# Patient Record
Sex: Male | Born: 1990 | Race: White | Hispanic: No | Marital: Single | Smoking: Former smoker
Health system: Southern US, Community
[De-identification: ages and names within clinical notes are randomized; demographics above are authoritative.]

---
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
## 2009-09-18 ENCOUNTER — Emergency Department (HOSPITAL_COMMUNITY): Admission: EM | Admit: 2009-09-18 | Discharge: 2009-09-18 | Payer: Self-pay | Admitting: Emergency Medicine

## 2010-03-05 ENCOUNTER — Emergency Department (HOSPITAL_COMMUNITY)
Admission: EM | Admit: 2010-03-05 | Discharge: 2010-03-05 | Disposition: A | Discharge status: Home / Self Care | Payer: Self-pay | Attending: Emergency Medicine | Admitting: Emergency Medicine

## 2010-03-05 DIAGNOSIS — L0501 Pilonidal cyst with abscess: Secondary | ICD-10-CM | POA: Insufficient documentation

## 2010-03-07 ENCOUNTER — Emergency Department (HOSPITAL_COMMUNITY)
Admission: EM | Admit: 2010-03-07 | Discharge: 2010-03-07 | Disposition: A | Discharge status: Home / Self Care | Payer: Self-pay | Attending: Emergency Medicine | Admitting: Emergency Medicine

## 2010-03-07 DIAGNOSIS — Z4801 Encounter for change or removal of surgical wound dressing: Secondary | ICD-10-CM | POA: Insufficient documentation

## 2010-03-07 DIAGNOSIS — I10 Essential (primary) hypertension: Secondary | ICD-10-CM | POA: Insufficient documentation

## 2010-03-29 LAB — URINE CULTURE
Colony Count: NO GROWTH
Culture  Setup Time: 201109061950
Culture: NO GROWTH

## 2010-03-29 LAB — URINE MICROSCOPIC-ADD ON

## 2010-03-29 LAB — DIFFERENTIAL
Basophils Absolute: 0.1 10*3/uL (ref 0.0–0.1)
Basophils Relative: 1 % (ref 0–1)
Eosinophils Absolute: 0.2 10*3/uL (ref 0.0–0.7)
Eosinophils Relative: 2 % (ref 0–5)
Lymphocytes Relative: 18 % (ref 12–46)
Lymphs Abs: 1.7 10*3/uL (ref 0.7–4.0)
Monocytes Absolute: 0.6 10*3/uL (ref 0.1–1.0)
Monocytes Relative: 6 % (ref 3–12)
Neutro Abs: 6.9 10*3/uL (ref 1.7–7.7)
Neutrophils Relative %: 73 % (ref 43–77)

## 2010-03-29 LAB — URINALYSIS, ROUTINE W REFLEX MICROSCOPIC
Bilirubin Urine: NEGATIVE
Glucose, UA: NEGATIVE mg/dL
Ketones, ur: NEGATIVE mg/dL
Nitrite: NEGATIVE
Specific Gravity, Urine: 1.03 (ref 1.005–1.030)
Urobilinogen, UA: 0.2 mg/dL (ref 0.0–1.0)
pH: 5.5 (ref 5.0–8.0)

## 2010-03-29 LAB — BASIC METABOLIC PANEL
Calcium: 9.4 mg/dL (ref 8.4–10.5)
Creatinine, Ser: 1.11 mg/dL (ref 0.4–1.5)
GFR calc Af Amer: >60 mL/min (ref >60)
GFR calc non Af Amer: >60 mL/min (ref >60)
Glucose, Bld: 110 mg/dL — ABNORMAL HIGH (ref 70–99)
Sodium: 140 mEq/L (ref 135–145)

## 2010-03-29 LAB — CBC
Hemoglobin: 15.9 g/dL (ref 13.0–17.0)
MCHC: 34.1 g/dL (ref 30.0–36.0)
Platelets: 229 10*3/uL (ref 150–400)
RBC: 5.27 MIL/uL (ref 4.22–5.81)

## 2010-10-23 LAB — URINALYSIS, ROUTINE W REFLEX MICROSCOPIC
Bilirubin Urine: NEGATIVE
Glucose, UA: NEGATIVE
Ketones, ur: NEGATIVE
pH: 6.5

## 2011-10-07 ENCOUNTER — Encounter (HOSPITAL_COMMUNITY): Payer: Self-pay | Admitting: *Deleted

## 2011-10-07 ENCOUNTER — Emergency Department (HOSPITAL_COMMUNITY): Payer: Self-pay

## 2011-10-07 ENCOUNTER — Emergency Department (HOSPITAL_COMMUNITY)
Admission: EM | Admit: 2011-10-07 | Discharge: 2011-10-07 | Disposition: A | Discharge status: Home / Self Care | Payer: Self-pay | Attending: Emergency Medicine | Admitting: Emergency Medicine

## 2011-10-07 DIAGNOSIS — F17200 Nicotine dependence, unspecified, uncomplicated: Secondary | ICD-10-CM | POA: Insufficient documentation

## 2011-10-07 DIAGNOSIS — S93499A Sprain of other ligament of unspecified ankle, initial encounter: Secondary | ICD-10-CM | POA: Insufficient documentation

## 2011-10-07 DIAGNOSIS — W1789XA Other fall from one level to another, initial encounter: Secondary | ICD-10-CM | POA: Insufficient documentation

## 2011-10-07 DIAGNOSIS — S93402A Sprain of unspecified ligament of left ankle, initial encounter: Secondary | ICD-10-CM

## 2011-10-07 MED ORDER — IBUPROFEN 600 MG PO TABS: 600.0000 mg | ORAL_TABLET | Freq: Four times a day (QID) | ORAL | Status: DC | PRN

## 2011-10-07 NOTE — ED Notes (Signed)
Fell when stepping off porch, ankle injury, with swelling and pain

## 2011-10-11 NOTE — ED Provider Notes (Signed)
Medical screening examination/treatment/procedure(s) were performed by non-physician practitioner and as supervising physician I was immediately available for consultation/collaboration.   Benny Lennert, MD 10/11/11 2250

## 2011-10-11 NOTE — ED Provider Notes (Signed)
History     CSN: [id number]  Arrival date & time 10/07/11  1412   First MD Initiated Contact with Patient 10/07/11 1455      Chief Complaint  Patient presents with  . Fall    (Consider location/radiation/quality/duration/timing/severity/associated sxs/prior treatment) HPI Comments: Jacob Odom presents with pain to his left ankle after he fell off is porch prior to arrival,  Inverting his left foot when he landed,  Feeling a "popping" sensation.  He can bear weight but with increased pain.  He has  Taken no medicines nor any treatments prior to arrival.  Pain is constant,  Worse with movement, palpation and range of motion.  There is no radiation of pain.  The history is provided by the patient.    History reviewed. No pertinent past medical history.  History reviewed. No pertinent past surgical history.  History reviewed. No pertinent family history.  History  Substance Use Topics  . Smoking status: Current Every Day Smoker  . Smokeless tobacco: Not on file  . Alcohol Use:       Review of Systems  Musculoskeletal: Positive for joint swelling and arthralgias.  Skin: Negative for wound.  Neurological: Negative for weakness and numbness.    Allergies  Review of patient's allergies indicates no known allergies.  Home Medications   Current Outpatient Rx  Name Route Sig Dispense Refill  . IBUPROFEN 600 MG PO TABS Oral Take 1 tablet (600 mg total) by mouth every 6 (six) hours as needed for pain. 30 tablet 0    BP 153/72  Pulse 91  Temp 99.8 F (37.7 C) (Oral)  Resp 18  Ht 6' (1.829 m)  Wt 285 lb (129.275 kg)  BMI 38.65 kg/m2  SpO2 100%  Physical Exam  Nursing note and vitals reviewed. Constitutional: He appears well-developed and well-nourished.  HENT:  Head: Normocephalic.  Cardiovascular: Normal rate and intact distal pulses.  Exam reveals no decreased pulses.   Pulses:      Dorsalis pedis pulses are 2+ on the right side, and 2+ on the left side.         Posterior tibial pulses are 2+ on the right side, and 2+ on the left side.  Musculoskeletal: He exhibits edema and tenderness.       Left ankle: He exhibits swelling. He exhibits no ecchymosis and normal pulse. tenderness. Lateral malleolus tenderness found. No proximal fibula tenderness found. Achilles tendon normal.  Neurological: He is alert. No sensory deficit.  Skin: Skin is warm, dry and intact.    ED Course  Procedures (including critical care time)  Labs Reviewed - No data to display No results found.   1. Left ankle sprain       MDM  ASO  Provided, pt has crutches at home.  Cap refill normal after ASO applied.  RICE, referral to ortho if pain symptoms and swelling are not better over the next week.            Burgess Amor, [place] 10/11/11 2216

## 2012-04-08 ENCOUNTER — Other Ambulatory Visit (HOSPITAL_COMMUNITY): Payer: Self-pay | Admitting: Physician Assistant

## 2012-04-08 DIAGNOSIS — R51 Headache: Secondary | ICD-10-CM

## 2012-04-10 ENCOUNTER — Ambulatory Visit (HOSPITAL_COMMUNITY)
Admission: RE | Admit: 2012-04-10 | Discharge: 2012-04-10 | Disposition: A | Discharge status: Home / Self Care | Payer: Self-pay | Source: Ambulatory Visit | Attending: Physician Assistant | Admitting: Physician Assistant

## 2012-04-10 DIAGNOSIS — R51 Headache: Secondary | ICD-10-CM | POA: Insufficient documentation

## 2012-04-10 MED ORDER — GADOBENATE DIMEGLUMINE 529 MG/ML IV SOLN
20.0000 mL | Freq: Once | INTRAVENOUS | Status: AC | PRN
  Administered 2012-04-10: 20 mL via INTRAVENOUS

## 2012-06-12 ENCOUNTER — Other Ambulatory Visit: Payer: Self-pay | Admitting: Occupational Medicine

## 2012-06-12 ENCOUNTER — Ambulatory Visit: Payer: Self-pay

## 2012-06-12 DIAGNOSIS — R52 Pain, unspecified: Secondary | ICD-10-CM

## 2014-08-01 ENCOUNTER — Ambulatory Visit (INDEPENDENT_AMBULATORY_CARE_PROVIDER_SITE_OTHER): Payer: Worker's Compensation | Admitting: Emergency Medicine

## 2014-08-01 ENCOUNTER — Ambulatory Visit: Payer: Worker's Compensation

## 2014-08-01 DIAGNOSIS — S6722XA Crushing injury of left hand, initial encounter: Secondary | ICD-10-CM

## 2014-08-01 DIAGNOSIS — S61219A Laceration without foreign body of unspecified finger without damage to nail, initial encounter: Secondary | ICD-10-CM

## 2014-08-01 DIAGNOSIS — S62639B Displaced fracture of distal phalanx of unspecified finger, initial encounter for open fracture: Secondary | ICD-10-CM

## 2014-08-01 MED ORDER — CEPHALEXIN 500 MG PO CAPS: 500.0000 mg | ORAL_CAPSULE | Freq: Four times a day (QID) | ORAL | Status: DC

## 2014-08-01 MED ORDER — HYDROCODONE-ACETAMINOPHEN 5-325 MG PO TABS
1.0000 | ORAL_TABLET | Freq: Four times a day (QID) | ORAL | Status: DC | PRN
Start: 2014-08-01 — End: 2014-08-31

## 2014-08-01 NOTE — Progress Notes (Signed)
Verbal consent obtained from patient.  Digital block anesthesia with 5cc 2% plain lido.  Wound scrubbed with soap and water and rinsed. Subcutaneous tissue, bony fragment, and excess flapping skin removed. Wound closed with #10 5-0 ethilon simple interrupted sutures.  Wound cleansed and dressed with pressure dressing. Bactroban topical applied over wound.

## 2014-08-01 NOTE — Progress Notes (Signed)
Subjective:  Patient ID: Jacob Odom, male    DOB: January 25, 1990  Age: 24 y.o. MRN: [id number]  CC: No chief complaint on file.   HPI Jacob Jacob Odom presents  with an injury to his left fourth finger. Was working holding the wheel and someone hit the wheel with a 4 x 4 piece of lumbar and struck him in the finger. He has intense pain with a less complex laceration of the distal phalanx of the finger. He denies any other complaints. He is not current on tetanus.  History Jacob Odom has no past medical history on file.   He has no past surgical history on file.   His  family history is not on file.    Review of Systems  Constitutional: Negative for fever, chills and appetite change.  HENT: Negative for congestion, ear pain, postnasal drip, sinus pressure and sore throat.   Eyes: Negative for pain and redness.  Respiratory: Negative for cough, shortness of breath and wheezing.   Cardiovascular: Negative for leg swelling.  Gastrointestinal: Negative for nausea, vomiting, abdominal pain, diarrhea, constipation and blood in stool.  Endocrine: Negative for polyuria.  Genitourinary: Negative for dysuria, urgency, frequency and flank pain.  Musculoskeletal: Negative for gait problem.  Skin: Negative for rash.  Neurological: Negative for weakness and headaches.  Psychiatric/Behavioral: Negative for confusion and decreased concentration. The patient is not nervous/anxious.     Objective:  There were no vitals taken for this visit.  Physical Exam  Constitutional: He is oriented to person, place, and time. He appears well-developed and well-nourished.  HENT:  Head: Normocephalic and atraumatic.  Eyes: Conjunctivae are normal. Pupils are equal, round, and reactive to light.  Pulmonary/Chest: Effort normal.  Musculoskeletal: He exhibits no edema.  Neurological: He is alert and oriented to person, place, and time.  Skin: Skin is dry. Laceration noted.  Psychiatric: He has a normal  mood and affect. His behavior is normal. Thought content normal.   is a complex laceration terminal phalanx of fourth finger of left hand. There is a fishmouth laceration not involving the nail and nailbed. There is no deformity there's evidence of neurovascular tendon injury. Is no foreign body visible.    Assessment & Plan:   Diagnoses and all orders for this visit:  Crushing injury of finger, left, initial encounter Orders: -     Tdap vaccine greater than or equal to 7yo IM -     DG Finger Ring Right; Future -     HYDROcodone-acetaminophen (NORCO) 5-325 MG per tablet; Take 1 tablet by mouth every 6 (six) hours as needed.  Open fracture of distal phalanx of left hand, initial encounter Orders: -     HYDROcodone-acetaminophen (NORCO) 5-325 MG per tablet; Take 1 tablet by mouth every 6 (six) hours as needed. -     cephALEXin (KEFLEX) 500 MG capsule; Take 1 capsule (500 mg total) by mouth 4 (four) times daily.  Laceration of finger of left hand, initial encounter   I am having Jacob Odom start on HYDROcodone-acetaminophen and cephALEXin. I am also having him maintain his ibuprofen.  Meds ordered this encounter  Medications  . HYDROcodone-acetaminophen (NORCO) 5-325 MG per tablet    Sig: Take 1 tablet by mouth every 6 (six) hours as needed.    Dispense:  10 tablet    Refill:  0    Order Specific Question:  Supervising Provider    Answer:  COPLAND, JESSICA C [[id number]]  . cephALEXin (KEFLEX) 500 MG capsule  Sig: Take 1 capsule (500 mg total) by mouth 4 (four) times daily.    Dispense:  40 capsule    Refill:  0    Order Specific Question:  Supervising Provider    Answer:  COPLAND, JESSICA C [[id number]]    Appropriate red flag conditions were discussed with the patient as well as actions that should be taken.  Patient expressed his understanding.  Follow-up: Return in about 1 week (around 08/08/2014).  Carmelina Dane, MD   [facility] reading (PRIMARY) by  Dr. Dareen Piano.  Findings:  Comminuted tuft fracture.

## 2014-08-01 NOTE — Patient Instructions (Signed)
Please take the antibiotic four times daily for 10 days.  Take the norco as needed.  Please come back to see [place] in 9 days for suture removal.  Clean the hand lightly with hydrogen peroxide if you notice blood crusting around the wound.  You received the tdap vaccine today.   Laceration Care, Adult A laceration is a cut or lesion that goes through all layers of the skin and into the tissue just beneath the skin. TREATMENT  Some lacerations may not require closure. Some lacerations may not be able to be closed due to an increased risk of infection. It is important to see your caregiver as soon as possible after an injury to minimize the risk of infection and maximize the opportunity for successful closure. If closure is appropriate, pain medicines may be given, if needed. The wound will be cleaned to help prevent infection. Your caregiver will use stitches (sutures), staples, wound glue (adhesive), or skin adhesive strips to repair the laceration. These tools bring the skin edges together to allow for faster healing and a better cosmetic outcome. However, all wounds will heal with a scar. Once the wound has healed, scarring can be minimized by covering the wound with sunscreen during the day for 1 full year. HOME CARE INSTRUCTIONS  For sutures or staples:  Keep the wound clean and dry.  If you were given a bandage (dressing), you should change it at least once a day. Also, change the dressing if it becomes wet or dirty, or as directed by your caregiver.  Wash the wound with soap and water 2 times a day. Rinse the wound off with water to remove all soap. Pat the wound dry with a clean towel.  After cleaning, apply a thin layer of the antibiotic ointment as recommended by your caregiver. This will help prevent infection and keep the dressing from sticking.  You may shower as usual after the first 24 hours. Do not soak the wound in water until the sutures are removed.  Only take over-the-counter  or prescription medicines for pain, discomfort, or fever as directed by your caregiver.  Get your sutures or staples removed as directed by your caregiver. For skin adhesive strips:  Keep the wound clean and dry.  Do not get the skin adhesive strips wet. You may bathe carefully, using caution to keep the wound dry.  If the wound gets wet, pat it dry with a clean towel.  Skin adhesive strips will fall off on their own. You may trim the strips as the wound heals. Do not remove skin adhesive strips that are still stuck to the wound. They will fall off in time. For wound adhesive:  You may briefly wet your wound in the shower or bath. Do not soak or scrub the wound. Do not swim. Avoid periods of heavy perspiration until the skin adhesive has fallen off on its own. After showering or bathing, gently pat the wound dry with a clean towel.  Do not apply liquid medicine, cream medicine, or ointment medicine to your wound while the skin adhesive is in place. This may loosen the film before your wound is healed.  If a dressing is placed over the wound, be careful not to apply tape directly over the skin adhesive. This may cause the adhesive to be pulled off before the wound is healed.  Avoid prolonged exposure to sunlight or tanning lamps while the skin adhesive is in place. Exposure to ultraviolet light in the first year will darken  the scar.  The skin adhesive will usually remain in place for 5 to 10 days, then naturally fall off the skin. Do not pick at the adhesive film. You may need a tetanus shot if:  You cannot remember when you had your last tetanus shot.  You have never had a tetanus shot. If you get a tetanus shot, your arm may swell, get red, and feel warm to the touch. This is common and not a problem. If you need a tetanus shot and you choose not to have one, there is a rare chance of getting tetanus. Sickness from tetanus can be serious. SEEK MEDICAL CARE IF:   You have redness,  swelling, or increasing pain in the wound.  You see a red line that goes away from the wound.  You have yellowish-white fluid (pus) coming from the wound.  You have a fever.  You notice a bad smell coming from the wound or dressing.  Your wound breaks open before or after sutures have been removed.  You notice something coming out of the wound such as wood or glass.  Your wound is on your hand or foot and you cannot move a finger or toe. SEEK IMMEDIATE MEDICAL CARE IF:   Your pain is not controlled with prescribed medicine.  You have severe swelling around the wound causing pain and numbness or a change in color in your arm, hand, leg, or foot.  Your wound splits open and starts bleeding.  You have worsening numbness, weakness, or loss of function of any joint around or beyond the wound.  You develop painful lumps near the wound or on the skin anywhere on your body. MAKE SURE YOU:   Understand these instructions.  Will watch your condition.  Will get help right away if you are not doing well or get worse. Document Released: 12/31/2004 Document Revised: 03/25/2011 Document Reviewed: 06/26/2010 [facility] Patient Information 2015 [employer], [place]. This information is not intended to replace advice given to you by your health care provider. Make sure you discuss any questions you have with your health care provider.

## 2014-08-10 ENCOUNTER — Ambulatory Visit (INDEPENDENT_AMBULATORY_CARE_PROVIDER_SITE_OTHER): Payer: Worker's Compensation | Admitting: Emergency Medicine

## 2014-08-10 VITALS — BP 126/88 | HR 83 | Temp 98.6°F | Resp 16 | Ht 72.0 in | Wt 290.4 lb

## 2014-08-10 DIAGNOSIS — S62639D Displaced fracture of distal phalanx of unspecified finger, subsequent encounter for fracture with routine healing: Secondary | ICD-10-CM | POA: Diagnosis not present

## 2014-08-10 DIAGNOSIS — S62639B Displaced fracture of distal phalanx of unspecified finger, initial encounter for open fracture: Secondary | ICD-10-CM | POA: Insufficient documentation

## 2014-08-10 NOTE — Progress Notes (Signed)
   Subjective:    Patient ID: Jacob Odom, male    DOB: 1990/02/16, 24 y.o.   MRN: [id number]  Chief Complaint  Patient presents with  . Suture / Staple Removal    left hand, workers comp injury   Medications, allergies, past medical history, surgical history, family history, social history and problem list reviewed and updated.  HPI  24 yom returns for wound check.   Had open tuft fx 9 days ago. Had 10 sutures placed. Placed on keflex and given fold over splint. Today returns stating doing well. Has been taking the keflex when he remembers, at least twice daily sometimes 3-4 times daily. Denies fevers, chills. Has been working daily. Wearing fold over at work and keeping wound open at home. Applying neosporin daily.   Review of Systems See HPI.     Objective:   Physical Exam  Constitutional: He appears well-developed and well-nourished.  Non-toxic appearance. He does not have a sickly appearance. He does not appear ill. No distress.  BP 126/88 mmHg  Pulse 83  Temp(Src) 98.6 F (37 C) (Oral)  Resp 16  Ht 6' (1.829 m)  Wt 290 lb 6.4 oz (131.725 kg)  BMI 39.38 kg/m2  SpO2 98%   Musculoskeletal:  Left 4th digit - wound healing well. #6 sutures removed, #4 sutures remaining to hold wound in place. Wound edges slightly separated in center of wound likely from swelling post injury. Small amnt subq tissue exposed from wound. No surround erythema. No purulence. Full rom with finger.   Neurological:  Decreased sensation left 4th digit tip.   Psychiatric: He has a normal mood and affect. His speech is normal and behavior is normal.      Assessment & Plan:   Open fracture of tuft of distal phalanx of finger, with routine healing, subsequent encounter --rtc one week for wound check with Dr Cleta Alberts who saw wound today --#4 sutures left in place to hold wound, new fold over splint given  --dc antibiotic topical, continue keflex ideally qid --keep wound covered at work, cleaned and  open at home  Donnajean Lopes, PA-C Physician Assistant-Certified [facility]  08/10/2014 10:34 AM

## 2014-08-17 ENCOUNTER — Ambulatory Visit: Payer: Worker's Compensation

## 2014-08-17 ENCOUNTER — Ambulatory Visit (INDEPENDENT_AMBULATORY_CARE_PROVIDER_SITE_OTHER): Payer: Worker's Compensation | Admitting: Emergency Medicine

## 2014-08-17 VITALS — BP 150/86 | HR 90 | Temp 98.3°F | Resp 16 | Ht 72.0 in | Wt 252.6 lb

## 2014-08-17 DIAGNOSIS — S62639S Displaced fracture of distal phalanx of unspecified finger, sequela: Secondary | ICD-10-CM | POA: Diagnosis not present

## 2014-08-17 NOTE — Progress Notes (Addendum)
Patient ID: Jacob Odom, male   DOB: [birth year], 24 y.o.   MRN: [id number]    This chart was scribed for Jacob Jordan, MD by [facility], medical scribe at [facility].The patient was seen in exam room 11 and the patient's care was started at 10:30 AM.  Chief Complaint:  Chief Complaint  Patient presents with  . 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 Initial injury was 2.5 weeks ago, last seen by Araceli Bouche, PA-C for a wound check. He had a open fracture of tuft of distal phalanx of the finger. Today, The finger has improved but still in some pain.   No Known Allergies Prior to Admission medications   Medication Sig Start Date End Date Taking? Authorizing Provider  HYDROcodone-acetaminophen (NORCO) 5-325 MG per tablet Take 1 tablet by mouth every 6 (six) hours as needed. Patient not taking: Reported on 08/10/2014 08/01/14   Araceli Bouche, PA   ROS: The patient denies fevers, chills, night sweats, unintentional weight loss, chest pain, palpitations, wheezing, dyspnea on exertion, nausea, vomiting, abdominal pain, dysuria, hematuria, melena, numbness, weakness, or tingling.   All other systems have been reviewed and were otherwise negative with the exception of those mentioned in the HPI and as above.    PHYSICAL EXAM: Filed Vitals:   08/17/14 0945  BP: 150/86  Pulse: 90  Temp: 98.3 F (36.8 C)  Resp: 16   Body mass index is 34.25 kg/(m^2).  General: Alert, no acute distress HEENT:  Normocephalic, atraumatic, oropharynx patent. Eye: Juliette Mangle [facility] Cardiovascular:  Regular rate and rhythm, no rubs murmurs or gallops.  No Carotid bruits, radial pulse intact. No pedal edema.  Respiratory: Clear to auscultation bilaterally.  No wheezes, rales, or rhonchi.  No cyanosis, no use of accessory musculature Abdominal: No  organomegaly, abdomen is soft and non-tender, positive bowel sounds.  No masses. Musculoskeletal: Gait intact. No edema, tenderness there is a healing wound of the distal portion of the left ring finger. There appears to be some dead skin adjacent to the nail. There is no evidence of infection. Skin: No rashes. Neurologic: Facial musculature symmetric. Psychiatric: Patient acts appropriately throughout our interaction. Lymphatic: No cervical or submandibular lymphadenopathy Genitourinary/Anorectal: No acute findings  LABS: Results for orders placed or performed during the hospital encounter of 09/18/09  Urine culture  Result Value Ref Range   Specimen Description URINE, CLEAN CATCH    Special Requests NONE    Culture  Setup Time [id number]    Colony Count NO GROWTH    Culture NO GROWTH    Report Status 09/20/2009 FINAL   Urinalysis, Routine w reflex microscopic  Result Value Ref Range   Color, Urine YELLOW YELLOW   APPearance CLEAR CLEAR   Specific Gravity, Urine 1.030 1.005 - 1.030   pH 5.5 5.0 - 8.0   Glucose, UA NEGATIVE NEGATIVE mg/dL   Hgb urine dipstick LARGE (A) NEGATIVE   Bilirubin Urine NEGATIVE NEGATIVE   Ketones, ur NEGATIVE NEGATIVE mg/dL   Protein, ur TRACE (A) NEGATIVE mg/dL   Urobilinogen, UA 0.2 0.0 - 1.0 mg/dL   Nitrite NEGATIVE NEGATIVE   Leukocytes, UA TRACE (A) NEGATIVE  Urine microscopic-add on  Result Value Ref Range   WBC, UA 0-2 <3 WBC/hpf   RBC / HPF 7-10 <3 RBC/hpf  Bacteria, UA FEW (A) RARE  Basic metabolic panel  Result Value Ref Range   Sodium 140 135 - 145 mEq/L   Potassium 4.4 3.5 - 5.1 mEq/L   Chloride 107 96 - 112 mEq/L   CO2 28 19 - 32 mEq/L   Glucose, Bld 110 (H) 70 - 99 mg/dL   BUN 9 6 - 23 mg/dL   Creatinine, Ser 1.11 0.4 - 1.5 mg/dL   Calcium 9.4 8.4 - 10.5 mg/dL   GFR calc non Af Amer >60 >60 mL/min   GFR calc Af Amer  >60 mL/min    >60        The eGFR has been calculated using the MDRD equation. This calculation has not  been validated in all clinical situations. eGFR's persistently <60 mL/min signify possible Chronic Kidney Disease.  CBC  Result Value Ref Range   WBC 9.5 4.0 - 10.5 K/uL   RBC 5.27 4.22 - 5.81 MIL/uL   Hemoglobin 15.9 13.0 - 17.0 g/dL   HCT 46.6 39.0 - 52.0 %   MCV 88.5 78.0 - 100.0 fL   MCH 30.2 26.0 - 34.0 pg   MCHC 34.1 30.0 - 36.0 g/dL   RDW 12.9 11.5 - 15.5 %   Platelets 229 150 - 400 K/uL  Differential  Result Value Ref Range   Neutrophils Relative % 73 43 - 77 %   Neutro Abs 6.9 1.7 - 7.7 K/uL   Lymphocytes Relative 18 12 - 46 %   Lymphs Abs 1.7 0.7 - 4.0 K/uL   Monocytes Relative 6 3 - 12 %   Monocytes Absolute 0.6 0.1 - 1.0 K/uL   Eosinophils Relative 2 0 - 5 %   Eosinophils Absolute 0.2 0.0 - 0.7 K/uL   Basophils Relative 1 0 - 1 %   Basophils Absolute 0.1 0.0 - 0.1 K/uL   EKG/XRAY:   Primary read interpreted by Dr. Everlene Farrier at [facility]. There is a significant tuft  fracture which is well aligned.  ASSESSMENT/PLAN:  Patient has a healing tuft fracture and healing wound of the left fourth finger. He needs to continue to protect that finger. We'll recheck in 2 weeks. Gross sideeffects, risk and benefits, and alternatives of medications d/w patient. Patient is aware that all medications have potential sideeffects and we are unable to predict every sideeffect or drug-drug interaction that may occur.    Arlyss Queen MD 08/17/2014 10:21 AM

## 2014-08-31 ENCOUNTER — Ambulatory Visit (INDEPENDENT_AMBULATORY_CARE_PROVIDER_SITE_OTHER): Payer: Worker's Compensation | Admitting: Emergency Medicine

## 2014-08-31 VITALS — BP 150/94 | HR 84 | Temp 98.8°F | Resp 20 | Ht 71.5 in | Wt 295.2 lb

## 2014-08-31 DIAGNOSIS — S62639S Displaced fracture of distal phalanx of unspecified finger, sequela: Secondary | ICD-10-CM

## 2014-08-31 NOTE — Patient Instructions (Signed)
Continue to keep covered in slpint at work Follow up in 1 month OTC pain medications as needed

## 2014-08-31 NOTE — Progress Notes (Signed)
   Subjective:    Patient ID: Jacob Odom, male    DOB: 1990-12-02, 24 y.o.   MRN: [id number]  HPI    Review of Systems     Objective:   Physical Exam        Assessment & Plan:   [facility] [address] [place] [address] [phone]  Date:  08/31/2014   Name:  Jacob Odom   DOB:  1990/02/15   MRN:  [id number]  PCP:  Lenise Herald, PA-C    Chief Complaint: Follow-up   History of Present Illness:  Jacob Odom is a 24 y.o. very pleasant male patient who presents with the following:  Pt here for [facility] follow up of open comminuted distal tuft fx of 4th right finger intial injury 08/01/14 Pt states he has still been working with finger splint and surrounding coband Pain continues to improve but still has pain with flexion/ext and contact Denies CP, SOB, N/V/D, no decreased ROM, no abd pain, no weakness or numbness and tingling except numbness in end of other    Review of Systems: All other pertinent ROS negative except as seen in HPI   Physical Examination: Filed Vitals:   08/31/14 1106  BP: 150/94  Pulse: 84  Temp: 98.8 F (37.1 C)  Resp: 20   Filed Vitals:   08/31/14 1106  Height: 5' 11.5" (1.816 m)  Weight: 295 lb 3.2 oz (133.902 kg)   Body mass index is 40.6 kg/(m^2). Ideal Body Weight: Weight in (lb) to have BMI = 25: 181.4  GEN: WDWN, NAD, Non-toxic, A & O x 3 HEENT: Atraumatic, Normocephalic. Neck supple. No masses, No LAD. Ears and Nose: No external deformity. CV: RRR, No M/G/R. No JVD. No thrill. No extra heart sounds. PULM: CTA B, no wheezes, crackles, rhonchi. No retractions. No resp. distress. No accessory muscle use. EXTR: No c/c/e, decreased sensation in tip of 4th left metacarpal, good active ROM in 4th finger in flexion/ext, mild decrease in strength, dry skin approximated to nail at cut site, no evidence of infection NEURO Normal gait.  PSYCH: Normally interactive. Conversant. Not depressed or  anxious appearing.  Calm demeanor.    Assessment and Plan: Fracture of distal phalanx of finger, open, sequela  Open cut healing well Continues to have pain that is improving and dry skin at cut site that is healing Cont to keep covered in splint at work and rtc in 1 month for reevaluation   Signed Ben Adams-Doolittle, DO

## 2014-09-28 ENCOUNTER — Ambulatory Visit (INDEPENDENT_AMBULATORY_CARE_PROVIDER_SITE_OTHER): Payer: Worker's Compensation | Admitting: Emergency Medicine

## 2014-09-28 VITALS — BP 128/80 | HR 82 | Temp 98.4°F | Resp 17 | Ht 73.0 in | Wt 303.0 lb

## 2014-09-28 DIAGNOSIS — S62639D Displaced fracture of distal phalanx of unspecified finger, subsequent encounter for fracture with routine healing: Secondary | ICD-10-CM | POA: Diagnosis not present

## 2014-09-28 NOTE — Progress Notes (Signed)
   Subjective:  This chart was scribed for  Lesle Chris MD, by Veverly Fells, at [facility].  This patient was seen in room 14 and the patient's care was started at 11:03 AM.    Patient ID: Jacob Odom, male    DOB: May 28, 1990, 24 y.o.   MRN: [id number] Chief Complaint  Patient presents with  . Follow-up    finger injury     HPI  HPI Comments: Jacob Odom is a 24 y.o. male who presents to the [facility] for a follow up after a left ring finger injury in August .He had a open fracture of tuft of distal phalanx of the finger.  Patient notes that the splint that he has currently is giving him more pain when he puts it on.  Patient has no other complaints or concerns today.    Patient Active Problem List   Diagnosis Date Noted  . Open fracture of tuft of distal phalanx of finger 08/10/2014   No past medical history on file. No past surgical history on file. No Known Allergies Prior to Admission medications   Not on File   Social History   Social History  . Marital Status: Single    Spouse Name: N/A  . Number of Children: N/A  . Years of Education: N/A   Occupational History  . Not on file.   Social History Main Topics  . Smoking status: Current Every Day Smoker -- 1.00 packs/day for 9 years    Types: Cigarettes  . Smokeless tobacco: Not on file  . Alcohol Use: Not on file  . Drug Use: No  . Sexual Activity: Not on file   Other Topics Concern  . Not on file   Social History Narrative        Review of Systems  Constitutional: Negative for fever and chills.  Respiratory: Negative for cough and shortness of breath.   Gastrointestinal: Negative for nausea and vomiting.  Musculoskeletal: Negative for neck pain and neck stiffness.       Objective:   Physical Exam  CONSTITUTIONAL: Well developed/well nourished HEAD: Normocephalic/atraumatic EYES: EOMI/PERRL NECK: supple no meningeal signs NEURO: Pt is  awake/alert/appropriate, moves all extremitiesx4.  No facial droop.   EXTREMITIES: pulses normal/equal, full ROM, He has persistent tenderness over the distal phalynx- ring finger.  Good range of motion.  SKIN: warm, color normal PSYCH: no abnormalities of mood noted, alert and oriented to situation  Filed Vitals:   09/28/14 0958  BP: 128/80  Pulse: 82  Temp: 98.4 F (36.9 C)  TempSrc: Oral  Resp: 17  Height:  (1.854 m)  Weight: 303 lb (137.44 kg)  SpO2: 98%       Assessment & Plan:    Patient is doing well, he will continue to splint his finger at work and recheck in 1 month when we will repeat x rays.I personally performed the services described in this documentation, which was scribed in my presence. The recorded information has been reviewed and is accurate.

## 2014-10-12 ENCOUNTER — Ambulatory Visit (INDEPENDENT_AMBULATORY_CARE_PROVIDER_SITE_OTHER): Payer: 59 | Admitting: Family Medicine

## 2014-10-12 VITALS — BP 130/80 | HR 88 | Temp 98.9°F | Resp 18 | Ht 73.0 in | Wt 295.0 lb

## 2014-10-12 DIAGNOSIS — R509 Fever, unspecified: Secondary | ICD-10-CM

## 2014-10-12 DIAGNOSIS — R8299 Other abnormal findings in urine: Secondary | ICD-10-CM

## 2014-10-12 DIAGNOSIS — H9201 Otalgia, right ear: Secondary | ICD-10-CM

## 2014-10-12 DIAGNOSIS — R82998 Other abnormal findings in urine: Secondary | ICD-10-CM

## 2014-10-12 DIAGNOSIS — R319 Hematuria, unspecified: Secondary | ICD-10-CM

## 2014-10-12 LAB — POCT CBC
Granulocyte percent: 61.2 % (ref 37–80)
HCT, POC: 46.3 % (ref 43.5–53.7)
Hemoglobin: 15.6 g/dL (ref 14.1–18.1)
Lymph, poc: 1.5 (ref 0.6–3.4)
MCH, POC: 29.5 pg (ref 27–31.2)
MCHC: 33.7 g/dL (ref 31.8–35.4)
MCV: 87.4 fL (ref 80–97)
MID (cbc): 0.3 (ref 0–0.9)
MPV: 8.2 fL (ref 0–99.8)
POC Granulocyte: 2.9 (ref 2–6.9)
POC LYMPH PERCENT: 31.9 %L (ref 10–50)
POC MID %: 6.9 % (ref 0–12)
Platelet Count, POC: 135 10*3/uL — AB (ref 142–424)
RBC: 5.3 M/uL (ref 4.69–6.13)
RDW, POC: 12.6 %
WBC: 4.8 10*3/uL (ref 4.6–10.2)

## 2014-10-12 LAB — COMPLETE METABOLIC PANEL WITH GFR
ALT: 88 U/L — ABNORMAL HIGH (ref 9–46)
Albumin: 4.2 g/dL (ref 3.6–5.1)
Alkaline Phosphatase: 38 U/L — ABNORMAL LOW (ref 40–115)
CO2: 25 mmol/L (ref 20–31)
Creat: 0.97 mg/dL (ref 0.60–1.35)
GFR, Est African American: >89 mL/min (ref >=60)
Glucose, Bld: 104 mg/dL — ABNORMAL HIGH (ref 65–99)
Total Bilirubin: 0.7 mg/dL (ref 0.2–1.2)
Total Protein: 6.7 g/dL (ref 6.1–8.1)

## 2014-10-12 LAB — POCT URINALYSIS DIP (MANUAL ENTRY)
Bilirubin, UA: NEGATIVE
Glucose, UA: NEGATIVE
Ketones, POC UA: NEGATIVE
Leukocytes, UA: NEGATIVE
Nitrite, UA: NEGATIVE
Protein Ur, POC: NEGATIVE
Spec Grav, UA: 1.025
Urobilinogen, UA: 2
pH, UA: 7

## 2014-10-12 LAB — COMPLETE METABOLIC PANEL WITHOUT GFR
AST: 50 U/L — ABNORMAL HIGH (ref 10–40)
BUN: 10 mg/dL (ref 7–25)
Calcium: 9.2 mg/dL (ref 8.6–10.3)
Chloride: 105 mmol/L (ref 98–110)
GFR, Est Non African American: >89 mL/min (ref >=60)
Potassium: 4.2 mmol/L (ref 3.5–5.3)
Sodium: 137 mmol/L (ref 135–146)

## 2014-10-12 LAB — POC MICROSCOPIC URINALYSIS (UMFC)

## 2014-10-12 MED ORDER — AMOXICILLIN-POT CLAVULANATE 875-125 MG PO TABS
1.0000 | ORAL_TABLET | Freq: Two times a day (BID) | ORAL | Status: DC
Start: 2014-10-12 — End: 2015-02-08

## 2014-10-12 NOTE — Progress Notes (Signed)
Chief Complaint:  Chief Complaint  Patient presents with  . Headache    since saturday   . Neck Pain  . dark urine  . Fever    101 last night  . Generalized Body Aches  . Chills    last night     HPI: Jacob Odom is a 24 y.o. male who reports to [facility] today complaining of no appetitie, he has had fevers Tmax 101 last night, has taken advil for this, diffuse headache,fatigue, flu like sxs for last 5 days He has generalized body aches . Dark urine, he has not had an appetite. He has had some neck pain. He has had chills. He is a smoker. He went to his doctor yesterday and blood was drawn but has no idea what his labs show, he feels clammy. HAs ear pain, denies cough, deies CP or SOB. Dneis rashes, nausea ,v omiting, diarrhea.  History reviewed. No pertinent past medical history. History reviewed. No pertinent past surgical history. Social History   Social History  . Marital Status: Single    Spouse Name: N/A  . Number of Children: N/A  . Years of Education: N/A   Social History Main Topics  . Smoking status: Current Every Day Smoker -- 1.00 packs/day for 9 years    Types: Cigarettes  . Smokeless tobacco: None  . Alcohol Use: None  . Drug Use: No  . Sexual Activity: Not Asked   Other Topics Concern  . None   Social History Narrative   History reviewed. No pertinent family history. No Known Allergies Prior to Admission medications   Not on File     ROS: The patient denies night sweats, unintentional weight loss, chest pain, palpitations, wheezing, dyspnea on exertion, nausea, vomiting, abdominal pain, dysuria, hematuria, melena, numbness, weakness, or tingling.   All other systems have been reviewed and were otherwise negative with the exception of those mentioned in the HPI and as above.    PHYSICAL EXAM: Filed Vitals:   10/12/14 1459  BP: 130/80  Pulse: 88  Temp: 98.9 F (37.2 C)  Resp: 18   SpO2 Readings from Last 3 Encounters:  10/12/14 99%    09/28/14 98%  08/31/14 98%    Body mass index is 38.93 kg/(m^2).   General: Alert, dehydrated oral mucosa, obese male HEENT:  Normocephalic, atraumatic, oropharynx patent. EOMI, PERRLA Tm RED , no effusion, no exudates, tonsils are large but not infected Cardiovascular:  Regular rate and rhythm, no rubs murmurs or gallops.  No Carotid bruits, radial pulse intact. No pedal edema.  Respiratory: Clear to auscultation bilaterally.  No wheezes, rales, or rhonchi.  No cyanosis, no use of accessory musculature Abdominal: No organomegaly, abdomen is soft and non-tender, positive bowel sounds. No masses. Skin: No rashes. Neurologic: Facial musculature symmetric. Psychiatric: Patient acts appropriately throughout our interaction. Lymphatic: No cervical or submandibular lymphadenopathy Musculoskeletal: Gait intact. No edema, tenderness No meningeal signs Full ROM of neck  UE and LE 5/5    LABS: Results for orders placed or performed in visit on 10/12/14  POCT CBC  Result Value Ref Range   WBC 4.8 4.6 - 10.2 K/uL   Lymph, poc 1.5 0.6 - 3.4   POC LYMPH PERCENT 31.9 10 - 50 %L   MID (cbc) 0.3 0 - 0.9   POC MID % 6.9 0 - 12 %M   POC Granulocyte 2.9 2 - 6.9   Granulocyte percent 61.2 37 - 80 %G   RBC  5.30 4.69 - 6.13 M/uL   Hemoglobin 15.6 14.1 - 18.1 g/dL   HCT, POC 11.9 14.7 - 53.7 %   MCV 87.4 80 - 97 fL   MCH, POC 29.5 27 - 31.2 pg   MCHC 33.7 31.8 - 35.4 g/dL   RDW, POC 82.9 %   Platelet Count, POC 135 (A) 142 - 424 K/uL   MPV 8.2 0 - 99.8 fL  POCT urinalysis dipstick  Result Value Ref Range   Color, UA straw (A) yellow   Clarity, UA clear clear   Glucose, UA negative negative   Bilirubin, UA negative negative   Ketones, POC UA negative negative   Spec Grav, UA 1.025    Blood, UA trace-intact (A) negative   pH, UA 7.0    Protein Ur, POC negative negative   Urobilinogen, UA 2.0    Nitrite, UA Negative Negative   Leukocytes, UA Negative Negative  POCT Microscopic  Urinalysis ([facility])  Result Value Ref Range   WBC,UR,HPF,POC Few (A) None WBC/hpf   RBC,UR,HPF,POC Moderate (A) None RBC/hpf   Bacteria None None   Mucus Present (A) Absent   Epithelial Cells, UR Per Microscopy Few (A) None cells/hpf     EKG/XRAY:   Primary read interpreted by Dr. Conley Rolls at [facility].   ASSESSMENT/PLAN: Encounter Diagnoses  Name Primary?  . Chills with fever Yes  . Dark urine   . Otalgia, right    24 year male who is here with 5 day hx of viral like sxs and also a right eardrum that looks like early OM and "normally red tonsils".  Labs look reassuring except for minimal thrombocytopenia which may be reactionary  but fiance is pregnant and is worried about infection He is outside window for flu testing  Will go ahead a presumptively treat for acute URI with early ear infection sxs.  He received IVF x 1 felt slightly better Advise to fu  For repeat CBC abd hematuria whenhe feels better and is hydrated.  Rx Augmentin Fu prn   Gross sideeffects, risk and benefits, and alternatives of medications d/w patient. Patient is aware that all medications have potential sideeffects and we are unable to predict every sideeffect or drug-drug interaction that may occur.  Thao Le DO  10/12/2014 4:42 PM

## 2014-10-13 ENCOUNTER — Telehealth: Payer: Self-pay | Admitting: Family Medicine

## 2014-10-13 NOTE — Telephone Encounter (Signed)
LM  about CMP, he has elevated liver enzymes, likely from tylenol use, advise to stop taking tylenol products and also pain pill with tylenol in it, he needs to let me know if I can add an acute hepatitis panel to his prior blood work but told him time frame.

## 2015-02-06 ENCOUNTER — Ambulatory Visit (INDEPENDENT_AMBULATORY_CARE_PROVIDER_SITE_OTHER): Payer: 59

## 2015-02-08 ENCOUNTER — Encounter: Payer: Self-pay | Admitting: Physician Assistant

## 2015-02-08 ENCOUNTER — Ambulatory Visit (INDEPENDENT_AMBULATORY_CARE_PROVIDER_SITE_OTHER): Payer: 59 | Admitting: Physician Assistant

## 2015-02-08 VITALS — BP 140/90 | HR 98 | Temp 98.0°F | Resp 16 | Ht 72.0 in | Wt 300.0 lb

## 2015-02-08 DIAGNOSIS — R030 Elevated blood-pressure reading, without diagnosis of hypertension: Secondary | ICD-10-CM

## 2015-02-08 DIAGNOSIS — Z23 Encounter for immunization: Secondary | ICD-10-CM

## 2015-02-08 DIAGNOSIS — IMO0001 Reserved for inherently not codable concepts without codable children: Secondary | ICD-10-CM

## 2015-02-08 DIAGNOSIS — H66011 Acute suppurative otitis media with spontaneous rupture of ear drum, right ear: Secondary | ICD-10-CM | POA: Diagnosis not present

## 2015-02-08 MED ORDER — AMOXICILLIN 875 MG PO TABS: 875.0000 mg | ORAL_TABLET | Freq: Two times a day (BID) | ORAL | Status: DC

## 2015-02-08 NOTE — Progress Notes (Signed)
   02/10/2015 8:43 AM   DOB: 1990-02-15 / MRN: [id number]  SUBJECTIVE:  Jacob Odom is a 25 y.o. male presenting for 1 week of right moderate ear pain and a change in hearing. Complains of drainage from the right ear.  No fever, chills.  No history of DM2.     He has No Known Allergies.   He  has no past medical history on file.    He  reports that he has been smoking Cigarettes.  He has a 9 pack-year smoking history. He does not have any smokeless tobacco history on file. He reports that he does not use illicit drugs. He  has no sexual activity history on file. The patient  has no past surgical history on file.  His family history is not on file.  Review of Systems  Constitutional: Negative for fever and chills.  HENT: Positive for ear pain. Negative for ear discharge, nosebleeds and sore throat.   Respiratory: Negative for cough.   Cardiovascular: Negative for chest pain.  Gastrointestinal: Negative for nausea.  Neurological: Negative for dizziness.    Problem list and medications reviewed and updated by myself where necessary, and exist elsewhere in the encounter.   OBJECTIVE:  BP 140/90 mmHg  Pulse 98  Temp(Src) 98 F (36.7 C)  Resp 16  Ht 6' (1.829 m)  Wt 300 lb (136.079 kg)  BMI 40.68 kg/m2  SpO2 98%  Physical Exam  Constitutional: He is oriented to person, place, and time. He appears well-developed. He does not appear ill.  HENT:  Right Ear: There is drainage. Tympanic membrane is injected and erythematous.  Left Ear: External ear normal. No drainage. Tympanic membrane is not injected and not erythematous.  Eyes: Conjunctivae and EOM are normal. Pupils are equal, round, and reactive to light.  Cardiovascular: Normal rate, regular rhythm, normal heart sounds and intact distal pulses.   Pulmonary/Chest: Effort normal.  Abdominal: He exhibits no distension.  Musculoskeletal: Normal range of motion.  Neurological: He is alert and oriented to person, place, and  time. No cranial nerve deficit. Coordination normal.  Skin: Skin is warm and dry. He is not diaphoretic.  Psychiatric: He has a normal mood and affect.  Nursing note and vitals reviewed.   No results found for this or any previous visit (from the past 72 hour(s)).  ASSESSMENT AND PLAN  Reiss was seen today for ear pain and flu vaccine.  Diagnoses and all orders for this visit:  Acute suppurative otitis media of right ear with spontaneous rupture of tympanic membrane, recurrence not specified -     amoxicillin (AMOXIL) 875 MG tablet; Take 1 tablet (875 mg total) by mouth 2 (two) times daily.  Need for prophylactic vaccination and inoculation against influenza -     Flu Vaccine QUAD 36+ mos PF IM (Fluarix & Fluzone Quad PF)  Elevated BP: Home monitoring advised.  If persistently greater than 140/90 he is to contact me or RTC.     The patient was advised to call or return to clinic if he does not see an improvement in symptoms or to seek the care of the closest emergency department if he worsens with the above plan.   [name], MHS, PA-C [facility] 02/10/2015 8:43 AM

## 2016-06-27 ENCOUNTER — Ambulatory Visit (INDEPENDENT_AMBULATORY_CARE_PROVIDER_SITE_OTHER): Payer: 59

## 2016-06-27 ENCOUNTER — Ambulatory Visit (INDEPENDENT_AMBULATORY_CARE_PROVIDER_SITE_OTHER): Payer: 59 | Admitting: Physician Assistant

## 2016-06-27 ENCOUNTER — Encounter: Payer: Self-pay | Admitting: Physician Assistant

## 2016-06-27 VITALS — BP 138/82 | HR 100 | Temp 98.7°F | Resp 16 | Ht 71.26 in | Wt 293.0 lb

## 2016-06-27 DIAGNOSIS — R0781 Pleurodynia: Secondary | ICD-10-CM

## 2016-06-27 DIAGNOSIS — R059 Cough, unspecified: Secondary | ICD-10-CM

## 2016-06-27 DIAGNOSIS — F17200 Nicotine dependence, unspecified, uncomplicated: Secondary | ICD-10-CM

## 2016-06-27 DIAGNOSIS — R05 Cough: Secondary | ICD-10-CM | POA: Diagnosis not present

## 2016-06-27 MED ORDER — BENZONATATE 200 MG PO CAPS: 200.0000 mg | ORAL_CAPSULE | Freq: Two times a day (BID) | ORAL | 0 refills | Status: DC | PRN

## 2016-06-27 MED ORDER — BUPROPION HCL ER (SR) 100 MG PO TB12: 100.0000 mg | ORAL_TABLET | Freq: Two times a day (BID) | ORAL | 3 refills | Status: DC

## 2016-06-27 MED ORDER — HYDROCODONE-HOMATROPINE 5-1.5 MG/5ML PO SYRP: 2.5000 mL | ORAL_SOLUTION | Freq: Every day | ORAL | 0 refills | Status: AC

## 2016-06-27 NOTE — Progress Notes (Signed)
06/27/2016 3:29 PM   DOB: 1990/03/17 / MRN: [id number]  SUBJECTIVE:  Jacob Odom is a 26 y.o. male presenting for dry cough that started Monday and says that this pain radiates to his back with coughing and exhaling. This is made worse by twisting his torso or taking a deep breat   Associates right ear pain and mild SOB with exertion at work.  Denies fever, chills. Nausea. Denies pain in his legs and or swelling. No history or family history of DVT/PE. Did have a runny nose yesterday.   Would like to quit smoking.  Smokes about 1.5 packs daily and feels that he is ready to quit.   He has No Known Allergies.   He  has no past medical history on file.    He  reports that he has been smoking Cigarettes.  He has a 9.00 pack-year smoking history. He has never used smokeless tobacco. He reports that he does not use drugs. He  has no sexual activity history on file. The patient  has no past surgical history on file.  His family history is not on file.  Review of Systems  Constitutional: Negative for chills and fever.  HENT: Positive for congestion and ear pain. Negative for ear discharge, hearing loss, nosebleeds, sinus pain, sore throat and tinnitus.   Respiratory: Positive for cough. Negative for hemoptysis, sputum production, shortness of breath, wheezing and stridor.   Cardiovascular: Negative for chest pain and leg swelling.  Gastrointestinal: Negative for nausea.  Skin: Negative for rash.  Neurological: Negative for dizziness.    The problem list and medications were reviewed and updated by myself where necessary and exist elsewhere in the encounter.   OBJECTIVE:  BP 138/82   Pulse 100   Temp 98.7 F (37.1 C) (Oral)   Resp 16   Ht 5' 11.26" (1.81 m)   Wt 293 lb (132.9 kg)   SpO2 98%   BMI 40.57 kg/m   Pulse Readings from Last 3 Encounters:  06/27/16 100  02/08/15 98  10/12/14 88     Physical Exam  Constitutional: He appears well-developed. He is active and  cooperative.  Non-toxic appearance.  HENT:  Right Ear: Hearing, tympanic membrane, external ear and ear canal normal.  Left Ear: Hearing, tympanic membrane, external ear and ear canal normal.  Nose: Nose normal. Right sinus exhibits no maxillary sinus tenderness and no frontal sinus tenderness. Left sinus exhibits no maxillary sinus tenderness and no frontal sinus tenderness.  Mouth/Throat: Uvula is midline, oropharynx is clear and moist and mucous membranes are normal. No oropharyngeal exudate, posterior oropharyngeal edema or tonsillar abscesses.  Eyes: Conjunctivae are normal. Pupils are equal, round, and reactive to light.  Cardiovascular: Normal rate, regular rhythm, S1 normal, S2 normal, normal heart sounds, intact distal pulses and normal pulses.  Exam reveals no gallop and no friction rub.   No murmur heard. Pulmonary/Chest: Effort normal and breath sounds normal. No stridor. No tachypnea. No respiratory distress. He has no wheezes. He has no rales. He exhibits no tenderness.  Abdominal: He exhibits no distension.  Musculoskeletal: He exhibits no edema or tenderness.  Lymphadenopathy:       Head (right side): No submandibular and no tonsillar adenopathy present.       Head (left side): No submandibular and no tonsillar adenopathy present.    He has no cervical adenopathy.  Neurological: He is alert.  Skin: Skin is warm and dry. He is not diaphoretic. No pallor.  Vitals reviewed.  EKG: NSR. Negative for hypertrophy, ischemia, infarction.   No results found for this or any previous visit (from the past 72 hour(s)).  Dg Chest 2 View  Result Date: 06/27/2016 CLINICAL DATA:  Cough and tachycardia EXAM: CHEST  2 VIEW COMPARISON:  None. FINDINGS: Lungs are clear. Heart size and pulmonary vascularity are normal. No adenopathy. No bone lesions. IMPRESSION: No edema or consolidation. Electronically Signed   By: Bretta BangWilliam  Woodruff III M.D.   On: 06/27/2016 15:11    ASSESSMENT AND  PLAN:  [name] was seen today for cough and chest pain.  Diagnoses and all orders for this visit:  Heavy smoker: He has tried nicotine replacement in the past and this did help to curb his appetite for smoking. -     buPROPion (WELLBUTRIN SR) 100 MG 12 hr tablet; Take 1 tablet (100 mg total) by mouth 2 (two) times daily.  Cough: Lung exam is normal.  Rads unremarkable.  Will try him on tessalon and hycodan at night.  -     DG Chest 2 View; Future  Pleuritic chest pain: EKG within normal limits.   The patient is advised to call or return to clinic if he does not see an improvement in symptoms, or to seek the care of the closest emergency department if he worsens with the above plan.   Deliah BostonMichael Camie Hauss, MHS, PA-C [facility] 06/27/2016 3:29 PM

## 2016-06-27 NOTE — Patient Instructions (Signed)
     IF you received an x-ray today, you will receive an invoice from [facility]. Please contact [facility] at [phone] with questions or concerns regarding your invoice.   IF you received labwork today, you will receive an invoice from [facility]. Please contact [facility] at [phone] with questions or concerns regarding your invoice.   Our billing staff will not be able to assist you with questions regarding bills from these companies.  You will be contacted with the lab results as soon as they are available. The fastest way to get your results is to activate your My Chart account. Instructions are located on the last page of this paperwork. If you have not heard from us regarding the results in 2 weeks, please contact this office.     

## 2017-02-03 LAB — HEMOGLOBIN A1C: Hemoglobin A1C: 11.9

## 2017-02-03 LAB — BASIC METABOLIC PANEL
BUN: 12 (ref 4–21)
Creatinine: 0.9 (ref <=1.3)

## 2017-02-03 LAB — TSH: TSH: 2.27 (ref <=5.90)

## 2017-03-03 ENCOUNTER — Ambulatory Visit: Payer: No Typology Code available for payment source | Admitting: "Endocrinology

## 2017-03-03 ENCOUNTER — Encounter: Payer: Self-pay | Admitting: "Endocrinology

## 2017-03-03 VITALS — BP 138/83 | HR 86 | Ht 71.0 in | Wt 285.0 lb

## 2017-03-03 DIAGNOSIS — E1165 Type 2 diabetes mellitus with hyperglycemia: Secondary | ICD-10-CM | POA: Diagnosis not present

## 2017-03-03 DIAGNOSIS — Z6839 Body mass index (BMI) 39.0-39.9, adult: Secondary | ICD-10-CM

## 2017-03-03 NOTE — Progress Notes (Signed)
Consult Note       03/03/2017, 5:52 PM   Subjective:    Patient ID: Jacob Odom, male    DOB: 05-31-1990.  Jacob Odom is being seen in consultation for management of currently uncontrolled symptomatic diabetes requested by  Jacob Lion, NP.   No past medical history on file. No past surgical history on file. Social History   Socioeconomic History  . Marital status: Single    Spouse name: Not on file  . Number of children: Not on file  . Years of education: Not on file  . Highest education level: Not on file  Social Needs  . Financial resource strain: Not on file  . Food insecurity - worry: Not on file  . Food insecurity - inability: Not on file  . Transportation needs - medical: Not on file  . Transportation needs - non-medical: Not on file  Occupational History  . Not on file  Tobacco Use  . Smoking status: Current Every Day Smoker    Packs/day: 1.00    Years: 9.00    Pack years: 9.00    Types: Cigarettes  . Smokeless tobacco: Never Used  Substance and Sexual Activity  . Alcohol use: No    Alcohol/week: 0.0 oz    Frequency: Never  . Drug use: No  . Sexual activity: Not on file  Other Topics Concern  . Not on file  Social History Narrative  . Not on file   Outpatient Encounter Medications as of 03/03/2017  Medication Sig  . metFORMIN (GLUCOPHAGE) 500 MG tablet Take by mouth 2 (two) times daily with a meal.  . [DISCONTINUED] insulin regular (NOVOLIN R,HUMULIN R) 100 units/mL injection Inject 1-5 Units into the skin 3 (three) times daily before meals.  . [DISCONTINUED] benzonatate (TESSALON) 200 MG capsule Take 1 capsule (200 mg total) by mouth 2 (two) times daily as needed for cough.  . [DISCONTINUED] buPROPion (WELLBUTRIN SR) 100 MG 12 hr tablet Take 1 tablet (100 mg total) by mouth 2 (two) times daily.   No facility-administered encounter medications on file as of  03/03/2017.     ALLERGIES: No Known Allergies  VACCINATION STATUS: Immunization History  Administered Date(s) Administered  . Influenza,inj,Quad PF,6+ Mos 02/08/2015  . Tdap 08/01/2014    Diabetes  He presents for his initial diabetic visit. He has type 2 diabetes mellitus. Onset time: Was diagnosed in January 2019 with A1c of 11.9%. His disease course has been improving. There are no hypoglycemic associated symptoms. Pertinent negatives for hypoglycemia include no confusion, headaches, pallor or seizures. Associated symptoms include polydipsia and polyuria. Pertinent negatives for diabetes include no chest pain, no fatigue, no polyphagia and no weakness. There are no hypoglycemic complications. Symptoms are improving. There are no diabetic complications. Risk factors for coronary artery disease include male sex, obesity and diabetes mellitus. Current diabetic treatment includes insulin injections and oral agent (monotherapy). His weight is decreasing steadily. He is following a generally unhealthy diet. When asked about meal planning, he reported none. He has not had a previous visit with a dietitian. He participates in exercise intermittently. An ACE inhibitor/angiotensin II receptor blocker  is not being taken. He does not see a podiatrist.Eye exam is not current.      Review of Systems  Constitutional: Negative for chills, fatigue, fever and unexpected weight change.  HENT: Negative for dental problem, mouth sores and trouble swallowing.   Eyes: Negative for visual disturbance.  Respiratory: Negative for cough, choking, chest tightness, shortness of breath and wheezing.   Cardiovascular: Negative for chest pain, palpitations and leg swelling.  Gastrointestinal: Negative for abdominal distention, abdominal pain, constipation, diarrhea, nausea and vomiting.  Endocrine: Positive for polydipsia and polyuria. Negative for polyphagia.  Genitourinary: Negative for dysuria, flank pain, hematuria  and urgency.  Musculoskeletal: Negative for back pain, gait problem, myalgias and neck pain.  Skin: Negative for pallor, rash and wound.  Neurological: Negative for seizures, syncope, weakness, numbness and headaches.  Psychiatric/Behavioral: Negative for confusion and dysphoric mood.    Objective:    BP 138/83   Pulse 86   Ht 5\' 11"  (1.803 m)   Wt 285 lb (129.3 kg)   BMI 39.75 kg/m   Wt Readings from Last 3 Encounters:  03/03/17 285 lb (129.3 kg)  06/27/16 293 lb (132.9 kg)  02/08/15 300 lb (136.1 kg)     Physical Exam  Constitutional: He is oriented to person, place, and time. He appears well-developed. He is cooperative. No distress.  HENT:  Head: Normocephalic and atraumatic.  Eyes: EOM are normal.  Neck: Normal range of motion. Neck supple. No tracheal deviation present. No thyromegaly present.  Cardiovascular: Normal rate, S1 normal, S2 normal and normal heart sounds. Exam reveals no gallop.  No murmur heard. Pulses:      Dorsalis pedis pulses are 1+ on the right side, and 1+ on the left side.       Posterior tibial pulses are 1+ on the right side, and 1+ on the left side.  Pulmonary/Chest: Breath sounds normal. No respiratory distress. He has no wheezes.  Abdominal: Soft. Bowel sounds are normal. He exhibits no distension. There is no tenderness. There is no guarding and no CVA tenderness.  Musculoskeletal: He exhibits no edema.       Right shoulder: He exhibits no swelling and no deformity.  Neurological: He is alert and oriented to person, place, and time. He has normal strength and normal reflexes. No cranial nerve deficit or sensory deficit. Gait normal.  Skin: Skin is warm and dry. No rash noted. No cyanosis. Nails show no clubbing.  Psychiatric: He has a normal mood and affect. His speech is normal. Cognition and memory are normal.    CMP ( most recent) CMP     Component Value Date/Time   NA 137 10/12/2014 1602   K 4.2 10/12/2014 1602   CL 105 10/12/2014  1602   CO2 25 10/12/2014 1602   GLUCOSE 104 (H) 10/12/2014 1602   BUN 12 02/03/2017   CREATININE 0.9 02/03/2017   CREATININE 0.97 10/12/2014 1602   CALCIUM 9.2 10/12/2014 1602   PROT 6.7 10/12/2014 1602   ALBUMIN 4.2 10/12/2014 1602   AST 50 (H) 10/12/2014 1602   ALT 88 (H) 10/12/2014 1602   ALKPHOS 38 (L) 10/12/2014 1602   BILITOT 0.7 10/12/2014 1602   GFRNONAA >89 10/12/2014 1602   GFRAA >89 10/12/2014 1602     Lab Results  Component Value Date   TSH 2.27 02/03/2017       Assessment & Plan:   1. Uncontrolled type 2 diabetes mellitus with hyperglycemia (HCC) - Jacob Odom has currently uncontrolled symptomatic type  2 DM since 27 years of age,  with most recent A1c of 11.9 %. Recent labs reviewed.  -his diabetes is complicated by obesity and Jacob Odom remains at a high risk for more acute and chronic complications which include CAD, CVA, CKD, retinopathy, and neuropathy. These are all discussed in detail with the patient.  - I have counseled him on diet management and weight loss, by adopting a carbohydrate restricted/protein rich diet.  - Suggestion is made for him to avoid simple carbohydrates  from his diet including Cakes, Sweet Desserts, Ice Cream, Soda (diet and regular), Sweet Tea, Candies, Chips, Cookies, Store Bought Juices, Alcohol in Excess of  1-2 drinks a day, Artificial Sweeteners, and "Sugar-free" Products. This will help patient to have stable blood glucose profile and potentially avoid unintended weight gain.  - I encouraged him to switch to  unprocessed or minimally processed complex starch and increased protein intake (animal or plant source), fruits, and vegetables.  - he is advised to stick to a routine mealtimes to eat 3 meals  a day and avoid unnecessary snacks ( to snack only to correct hypoglycemia).   - he will be scheduled with Jacob Odom, RDN, Jacob Odom for individualized diabetes education.  - I have approached him with the following  individualized plan to manage diabetes and patient agrees:   -He appears motivated and has adequate support from his fiance and wishes to minimize injectable therapy at this time.  - I  will proceed to discontinue his Humulin R which he takes 1-5 units 3 times daily before meals.    - I will increase metformin to 1000 mg twice a day, therapeutically suitable for patient .  - he will be considered for incretin therapy as appropriate next visit. - Patient specific target  A1c;  LDL, HDL, Triglycerides, and  Waist Circumference were discussed in detail.  2) BP/HTN: Controlled to target.  Patient is not on any antihypertensive medications.    3) Lipids/HPL: Lipid panel unknown, patient is not on statins. 4)  Weight/Diet: Jacob Odom Consult will be initiated , exercise, and detailed carbohydrates information provided.  5) Chronic Care/Health Maintenance:  -he    is encouraged to continue to follow up with Ophthalmology, Dentist,  Podiatrist at least yearly or according to recommendations, and advised to  Quit smoking. I have recommended yearly flu vaccine and pneumonia vaccination at least every 5 years; moderate intensity exercise for up to 150 minutes weekly; and  sleep for at least 7 hours a day.  - I advised patient to maintain close follow up with Jacob LionHazy, Marian, NP for primary care needs.  - Time spent with the patient: 1 hour, of which >50% was spent in obtaining information about his symptoms, reviewing his previous labs, evaluations, and treatments, counseling him about his currently uncontrolled type 2 diabetes, and developing a plan for long term treatment; his  questions were answered to his satisfaction.  Follow up plan: - Return in about 10 weeks (around 05/12/2017) for follow up with pre-visit labs.  Jacob LunchGebre Randell Teare, MD [facility] [address] [place], [place] [address] Phone: [phone]  Fax: [phone]    03/03/2017, 5:52  PM  This note was partially dictated with voice recognition software. Similar sounding words can be transcribed inadequately or may not  be corrected upon review.

## 2017-03-03 NOTE — Patient Instructions (Signed)

## 2017-03-12 ENCOUNTER — Other Ambulatory Visit: Payer: Self-pay

## 2017-03-12 MED ORDER — METFORMIN HCL 500 MG PO TABS: 1000.0000 mg | ORAL_TABLET | Freq: Two times a day (BID) | ORAL | 2 refills | Status: DC

## 2017-04-09 ENCOUNTER — Ambulatory Visit: Payer: Self-pay | Admitting: Nutrition

## 2017-05-12 ENCOUNTER — Ambulatory Visit: Payer: Self-pay | Admitting: "Endocrinology

## 2017-05-14 ENCOUNTER — Encounter: Payer: No Typology Code available for payment source | Attending: "Endocrinology | Admitting: Nutrition

## 2017-05-14 VITALS — Ht 72.0 in | Wt 287.0 lb

## 2017-05-14 DIAGNOSIS — E118 Type 2 diabetes mellitus with unspecified complications: Secondary | ICD-10-CM

## 2017-05-14 DIAGNOSIS — E1165 Type 2 diabetes mellitus with hyperglycemia: Secondary | ICD-10-CM | POA: Insufficient documentation

## 2017-05-14 DIAGNOSIS — Z713 Dietary counseling and surveillance: Secondary | ICD-10-CM | POA: Insufficient documentation

## 2017-05-14 DIAGNOSIS — E669 Obesity, unspecified: Secondary | ICD-10-CM

## 2017-05-14 DIAGNOSIS — IMO0002 Reserved for concepts with insufficient information to code with codable children: Secondary | ICD-10-CM

## 2017-05-14 NOTE — Progress Notes (Signed)
  Diabetes Self-Management Education  Visit Type: First/Initial  Appt. Start Time: 1530 Appt. End Time: 1700 05-14-17  Mr. Jacob Odom, identified by name and date of birth, is a 27 y.o. male with a diagnosis of Diabetes: Type 2. . 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 Mows yards on side,  Eatign 2 meals per day. Wt: 280-300's  ASSESSMENT  Height 6' (1.829 m), weight 287 lb (130.2 kg). Body mass index is 38.92 kg/m.    Individualized Plan for Diabetes Self-Management Training:   Learning Objective:  Patient will have a greater understanding of diabetes self-management. Patient education plan is to attend individual and/or group sessions per assessed needs and concerns.   Plan:   Patient Instructions  Goals 1. Follow my Plate 2. Eat three meals per day at times discussed. 3. Increase fresh fruits and vegetables 4. Drink only water 5. Get A1C  Down to 7% No snacks between meals Test blood sugar in am and before bed.    Expected Outcomes:  Demonstrated interest in learning. Expect positive outcomes  Education material provided: Living Well with Diabetes, Food label handouts, A1C conversion sheet, Meal plan card, My Plate and Carbohydrate counting sheet  If problems or questions, patient to contact team via:  Phone and Email  Future DSME appointment: 4-6 wks

## 2017-05-14 NOTE — Patient Instructions (Signed)
Goals 1. Follow my Plate 2. Eat three meals per day at times discussed. 3. Increase fresh fruits and vegetables 4. Drink only water 5. Get A1C  Down to 7% No snacks between meals Test blood sugar in am and before bed.

## 2017-05-28 ENCOUNTER — Encounter: Payer: Self-pay | Admitting: Nutrition

## 2017-06-02 ENCOUNTER — Ambulatory Visit: Payer: Self-pay | Admitting: Nutrition

## 2017-06-02 ENCOUNTER — Ambulatory Visit: Payer: Self-pay | Admitting: "Endocrinology

## 2017-06-19 ENCOUNTER — Ambulatory Visit: Payer: Self-pay | Admitting: "Endocrinology

## 2017-08-21 ENCOUNTER — Other Ambulatory Visit: Payer: Self-pay

## 2017-08-21 ENCOUNTER — Encounter: Payer: Self-pay | Admitting: Physician Assistant

## 2017-08-21 ENCOUNTER — Ambulatory Visit (INDEPENDENT_AMBULATORY_CARE_PROVIDER_SITE_OTHER): Payer: No Typology Code available for payment source

## 2017-08-21 ENCOUNTER — Ambulatory Visit: Payer: No Typology Code available for payment source | Admitting: Physician Assistant

## 2017-08-21 VITALS — BP 136/84 | HR 104 | Temp 98.8°F | Resp 20 | Ht 71.85 in | Wt 293.0 lb

## 2017-08-21 DIAGNOSIS — G44209 Tension-type headache, unspecified, not intractable: Secondary | ICD-10-CM

## 2017-08-21 DIAGNOSIS — M542 Cervicalgia: Secondary | ICD-10-CM

## 2017-08-21 MED ORDER — CYCLOBENZAPRINE HCL 5 MG PO TABS: 5.0000 mg | ORAL_TABLET | Freq: Three times a day (TID) | ORAL | 1 refills | Status: AC | PRN

## 2017-08-21 NOTE — Progress Notes (Signed)
Jacob Odom  MRN: [id number] DOB: Jun 23, 1990  PCP: Ronney Lion, NP  Chief Complaint  Patient presents with  . Headache    X 3 weeks with neck pain    Subjective:  Pt presents to clinic for headache and neck pain for the last 3 weeks.  - he wakes up without pain -- about 30 mins later he starts to have neck pain and then about 30-45min later he develops the headache. The pain starts in the center of his neck and then the headache goes to both sides of his temples - throbbing pain - he has used tylenol but it only helps a little bit but then it will come back. The headaches remains until he goes to sleep.  When patient is at work he tries to have good posture but he does know that he hangs his head.  He has a 62-year-old son that he will sometimes give a shoulder arrived and he finds that after this his neck hurts and he has worsening pain.  He does note that his head is pushed in a flexion position with both of these activities.  He is sleeping well at night.  No sinus congestion or cold symptoms.  He does not typically get headaches. He drinks water.  DM - checks his glucose at work 120-200s - the readings are not higher in this time frame.  Since his diagnosis of diabetes he has reduced sweet tea and sweet desserts to almost nothing.  Telemedicine through insurance - 7/25 - he was told it was sinus and he was given sinus spray that he used a coupe of days that cleared out the nose of his congestion - then again on 8/5 he called about his neck pain and they gave him mobic but it has not helped his pain at all.  History is obtained by patient.  Review of Systems  Eyes: Negative for visual disturbance.  Neurological: Positive for headaches. Negative for dizziness.    Patient Active Problem List   Diagnosis Date Noted  . Uncontrolled type 2 diabetes mellitus with hyperglycemia (HCC) 03/03/2017  . Class 2 severe obesity due to excess calories with serious comorbidity and body mass  index (BMI) of 39.0 to 39.9 in adult [facility]) 03/03/2017    Current Outpatient Medications on File Prior to Visit  Medication Sig Dispense Refill  . metFORMIN (GLUCOPHAGE) 500 MG tablet Take 2 tablets (1,000 mg total) by mouth 2 (two) times daily with a meal. 120 tablet 2   No current facility-administered medications on file prior to visit.     No Known Allergies  History reviewed. No pertinent past medical history. Social History   Social History Narrative  . Not on file   Social History   Tobacco Use  . Smoking status: Former Smoker    Packs/day: 1.00    Years: 9.00    Pack years: 9.00    Types: Cigarettes    Last attempt to quit: 07/16/2017    Years since quitting: 0.0  . Smokeless tobacco: Never Used  Substance Use Topics  . Alcohol use: Yes    Alcohol/week: 0.0 standard drinks    Frequency: Never    Comment: occ  . Drug use: No   family history is not on file.     Objective:  BP 136/84 (BP Location: Right Arm, Patient Position: Sitting, Cuff Size: Large)   Pulse (!) 104   Temp 98.8 F (37.1 C) (Oral)   Resp 20   Ht  5' 11.85" (1.825 m)   Wt 293 lb (132.9 kg)   SpO2 98%   BMI 39.90 kg/m  Body mass index is 39.9 kg/m.  Wt Readings from Last 3 Encounters:  08/21/17 293 lb (132.9 kg)  05/14/17 287 lb (130.2 kg)  03/03/17 285 lb (129.3 kg)    Physical Exam  Constitutional: He is oriented to person, place, and time. He appears well-developed and well-nourished.  HENT:  Head: Normocephalic and atraumatic.  Right Ear: External ear normal.  Left Ear: External ear normal.  Eyes: Pupils are equal, round, and reactive to light. Conjunctivae, EOM and lids are normal.  Neck: Normal range of motion.  Cardiovascular: Normal rate, regular rhythm and normal heart sounds.  No murmur heard. Pulmonary/Chest: Effort normal and breath sounds normal. He has no wheezes.  Musculoskeletal:       Cervical back: He exhibits tenderness (Over C5-C6 area) and spasm (Bilateral  trapezius muscle spasms). He exhibits normal range of motion.  Neurological: He is alert and oriented to person, place, and time. He has normal strength and normal reflexes. No cranial nerve deficit or sensory deficit.  Skin: Skin is warm and dry.  Psychiatric: Judgment normal.  Vitals reviewed.  Dg Cervical Spine 2 Or 3 Views  Result Date: 08/21/2017 CLINICAL DATA:  Neck pain for 3 weeks, no known injury, initial encounter EXAM: CERVICAL SPINE - 2-3 VIEW COMPARISON:  None. FINDINGS: Seven cervical segments are well visualized. Vertebral body height is well maintained. No prevertebral soft tissue changes are seen. The odontoid is within normal limits. IMPRESSION: No acute abnormality noted. Electronically Signed   By: Alcide CleverMark  Lukens M.D.   On: 08/21/2017 12:23    Assessment and Plan :  Neck pain - Plan: DG Cervical Spine 2 or 3 views, cyclobenzaprine (FLEXERIL) 5 MG tablet  Acute non intractable tension-type headache - Plan: DG Cervical Spine 2 or 3 views   I suspect this is positional and musculoskeletal in origin.  He will continue the Mobic, he will add Flexeril (we discussed being groggy especially with his job and if this happens he will contact to be).  He will use heat on the area suggested massage.  He may need physical therapy to help with this due to the length of time he has had it.  Patient verbalized to me that they understand the following: diagnosis, what is being done for them, what to expect and what should be done at home.  Their questions have been answered.  See after visit summary for patient specific instructions.  Benny LennertSarah Weber PA-C  [facility] at [facility] 08/21/2017 12:32 PM  Please note: Portions of this report may have been transcribed using dragon voice recognition software. Every effort was made to ensure accuracy; however, inadvertent computerized transcription errors may be present.

## 2017-08-21 NOTE — Patient Instructions (Addendum)
Please download the APP called mychart - then use the text to activate this APP - this will allow you to look at your labs and contact me as well as make appointments to see me in the future.     IF you received an x-ray today, you will receive an invoice from [facility]. Please contact  Radiology at [phone] with questions or concerns regarding your invoice.   IF you received labwork today, you will receive an invoice from [facility]. Please contact [facility] at [phone] with questions or concerns regarding your invoice.   Our billing staff will not be able to assist you with questions regarding bills from these companies.  You will be contacted with the lab results as soon as they are available. The fastest way to get your results is to activate your My Chart account. Instructions are located on the last page of this paperwork. If you have not heard from us regarding the results in 2 weeks, please contact this office.     

## 2017-09-16 ENCOUNTER — Other Ambulatory Visit: Payer: Self-pay

## 2017-09-16 ENCOUNTER — Ambulatory Visit (INDEPENDENT_AMBULATORY_CARE_PROVIDER_SITE_OTHER): Payer: No Typology Code available for payment source | Admitting: Physician Assistant

## 2017-09-16 ENCOUNTER — Encounter: Payer: Self-pay | Admitting: Physician Assistant

## 2017-09-16 VITALS — BP 118/80 | HR 96 | Temp 98.4°F | Resp 18 | Ht 72.0 in | Wt 295.2 lb

## 2017-09-16 DIAGNOSIS — Z0000 Encounter for general adult medical examination without abnormal findings: Secondary | ICD-10-CM | POA: Diagnosis not present

## 2017-09-16 DIAGNOSIS — R945 Abnormal results of liver function studies: Secondary | ICD-10-CM

## 2017-09-16 DIAGNOSIS — Z114 Encounter for screening for human immunodeficiency virus [HIV]: Secondary | ICD-10-CM

## 2017-09-16 DIAGNOSIS — Z23 Encounter for immunization: Secondary | ICD-10-CM

## 2017-09-16 DIAGNOSIS — Z1329 Encounter for screening for other suspected endocrine disorder: Secondary | ICD-10-CM | POA: Diagnosis not present

## 2017-09-16 DIAGNOSIS — Z130 Encounter for screening for diseases of the blood and blood-forming organs and certain disorders involving the immune mechanism: Secondary | ICD-10-CM

## 2017-09-16 DIAGNOSIS — E1165 Type 2 diabetes mellitus with hyperglycemia: Secondary | ICD-10-CM

## 2017-09-16 DIAGNOSIS — E786 Lipoprotein deficiency: Secondary | ICD-10-CM

## 2017-09-16 DIAGNOSIS — R7989 Other specified abnormal findings of blood chemistry: Secondary | ICD-10-CM

## 2017-09-16 DIAGNOSIS — Z6839 Body mass index (BMI) 39.0-39.9, adult: Secondary | ICD-10-CM

## 2017-09-16 DIAGNOSIS — R0683 Snoring: Secondary | ICD-10-CM

## 2017-09-16 MED ORDER — METFORMIN HCL 1000 MG PO TABS: 1000.0000 mg | ORAL_TABLET | Freq: Two times a day (BID) | ORAL | 1 refills | Status: AC

## 2017-09-16 NOTE — Progress Notes (Signed)
Jacob Odom  MRN: [id number] DOB: February 02, 1990  PCP: Patient, No Pcp Per   Chief Complaint  Patient presents with  . Annual Exam    Subjective:  Pt presents to clinic for a CPE.  Snores - better since stop smoking - wife used to tell him he would stop breathing - he is unsure if this still happens.  Glucose - 200s in the am - no sweet tea, no energy drinks, no candy - eats bread  Last dental exam: every 6 months Last vision exam: June - he was not dilated  Last PCP was not aggressive with DM - he has made a lot of diet changes since then and has been taking his glucophage as directed  Vaccinations - UTD  Typical meals for patient: trying to decrease white carbs and has decrease sweets in both food and beverages Typical beverage choices: water Exercises: activity at work Sleeps: snores  Patient Active Problem List   Diagnosis Date Noted  . Uncontrolled type 2 diabetes mellitus with hyperglycemia ([place]) 03/03/2017  . Class 2 severe obesity due to excess calories with serious comorbidity and body mass index (BMI) of 39.0 to 39.9 in adult [facility]) 03/03/2017    Patient Care Team: Patient, No Pcp Per as PCP - General (General Practice)  Review of Systems  Constitutional: Negative.   HENT: Negative.   Eyes: Negative.   Respiratory: Negative.   Cardiovascular: Negative.   Gastrointestinal: Negative.   Endocrine: Negative.   Genitourinary: Negative.   Musculoskeletal: Negative.   Skin: Negative.   Allergic/Immunologic: Negative.   Neurological: Negative.   Hematological: Negative.   Psychiatric/Behavioral: Negative.      Current Outpatient Medications on File Prior to Visit  Medication Sig Dispense Refill  . cyclobenzaprine (FLEXERIL) 5 MG tablet Take 1 tablet (5 mg total) by mouth 3 (three) times daily as needed for muscle spasms. 30 tablet 1   No current facility-administered medications on file prior to visit.     No Known Allergies  Social History    Socioeconomic History  . Marital status: Single    Spouse name: Not on file  . Number of children: 1  . Years of education: Not on file  . Highest education level: Not on file  Occupational History  . Not on file  Social Needs  . Financial resource strain: Not on file  . Food insecurity:    Worry: Not on file    Inability: Not on file  . Transportation needs:    Medical: Not on file    Non-medical: Not on file  Tobacco Use  . Smoking status: Former Smoker    Packs/day: 1.00    Years: 9.00    Pack years: 9.00    Types: Cigarettes, E-cigarettes    Last attempt to quit: 07/16/2017    Years since quitting: 0.1  . Smokeless tobacco: Never Used  Substance and Sexual Activity  . Alcohol use: Yes    Alcohol/week: 0.0 standard drinks    Frequency: Never    Comment: occ  . Drug use: No  . Sexual activity: Yes    Partners: Female  Lifestyle  . Physical activity:    Days per week: Not on file    Minutes per session: Not on file  . Stress: Not on file  Relationships  . Social connections:    Talks on phone: Not on file    Gets together: Not on file    Attends religious service: Not on file  Active member of club or organization: Not on file    Attends meetings of clubs or organizations: Not on file    Relationship status: Not on file  Other Topics Concern  . Not on file  Social History Narrative   Lives with finance and son      Seatbelt - no   Gun in home- secured    History reviewed. No pertinent surgical history.  Family History  Problem Relation Age of Onset  . Suicidality Father   . High Cholesterol Sister   . Diabetes Sister      Objective:  BP 118/80   Pulse 96   Temp 98.4 F (36.9 C) (Oral)   Resp 18   Ht 6' (1.829 m)   Wt 295 lb 3.2 oz (133.9 kg)   SpO2 97%   BMI 40.04 kg/m   Physical Exam  Constitutional: He is oriented to person, place, and time. He appears well-developed and well-nourished.  HENT:  Head: Normocephalic and atraumatic.   Right Ear: Hearing, tympanic membrane, external ear and ear canal normal.  Left Ear: Hearing, tympanic membrane, external ear and ear canal normal.  Nose: Nose normal.  Mouth/Throat: Uvula is midline, oropharynx is clear and moist and mucous membranes are normal.  Eyes: Pupils are equal, round, and reactive to light. Conjunctivae and EOM are normal.  Neck: Trachea normal and normal range of motion. Neck supple. No thyroid mass and no thyromegaly present.  Cardiovascular: Normal rate, regular rhythm and normal heart sounds.  No murmur heard. Pulmonary/Chest: Effort normal and breath sounds normal.  Abdominal: Soft. Bowel sounds are normal.  Musculoskeletal: Normal range of motion.  Neurological: He is alert and oriented to person, place, and time.  Skin: Skin is warm and dry.  Psychiatric: Judgment normal.  Vitals reviewed.   Wt Readings from Last 3 Encounters:  09/16/17 295 lb 3.2 oz (133.9 kg)  08/21/17 293 lb (132.9 kg)  05/14/17 287 lb (130.2 kg)   Assessment and Plan :  Annual physical exam - anticipatory guidance  Screening for deficiency anemia - Plan: CBC with Differential/Platelet  Uncontrolled type 2 diabetes mellitus with hyperglycemia ([place]) - Plan: CMP14+EGFR, Hemoglobin A1c, Lipid panel, TSH, Pneumococcal polysaccharide vaccine 23-valent greater than or equal to 2yo subcutaneous/IM, metFORMIN (GLUCOPHAGE) 1000 MG tablet - continue metformin - d/w pt that he will likely need additional medication for DM - GLP1and SLGT2 were discussed with patient- this will be determined when his lab resuls are back - he has been on insulin when he was 1st diagnosed and then last PCP stopped. He saw endo which he did not find helpful as no labs were done and nothing was changed with his medications - he does not want to go back and would rather his care be here in the future.  Class 2 severe obesity due to excess calories with serious comorbidity and body mass index (BMI) of 39.0 to 39.9 in  adult [facility]) - Plan: Lipid panel, Ambulatory referral to Sleep Studies - suspect sleep apnea possible  Snores - Plan: Ambulatory referral to Sleep Studies  Screening for thyroid disorder - Plan: TSH  Need for 23-polyvalent pneumococcal polysaccharide vaccine - Plan: Pneumococcal polysaccharide vaccine 23-valent greater than or equal to 2yo subcutaneous/IM  Screening for HIV (human immunodeficiency virus) - Plan: HIV antibody  Windell Hummingbird PA-C  [facility] 09/16/2017 4:38 PM  Please note: Portions of this report may have been transcribed using dragon voice recognition software. Every effort  was made to ensure accuracy; however, inadvertent computerized transcription errors may be present.

## 2017-09-16 NOTE — Patient Instructions (Addendum)
When you diabetes labs return we will decide which additional medications you need.  We will call and let you know unless you have mychart set up by then.  If you have lab work done today you will be contacted with your lab results within the next 2 weeks.  If you have not heard from [place] then please contact us. The fastest way to get your results is to register for My Chart.   IF you received an x-ray today, you will receive an invoice from [facility]. Please contact [facility] at [phone] with questions or concerns regarding your invoice.   IF you received labwork today, you will receive an invoice from [place]. Please contact [facility] at [phone] with questions or concerns regarding your invoice.   Our billing staff will not be able to assist you with questions regarding bills from these companies.  You will be contacted with the lab results as soon as they are available. The fastest way to get your results is to activate your My Chart account. Instructions are located on the last page of this paperwork. If you have not heard from [place] regarding the results in 2 weeks, please contact this office.    Keeping you healthy  Get these tests  Blood pressure- Have your blood pressure checked once a year by your healthcare provider.  Normal blood pressure is 120/80.  Weight- Have your body mass index (BMI) calculated to screen for obesity.  BMI is a measure of body fat based on height and weight. You can also calculate your own BMI at https://www.west-esparza.com/.  Cholesterol- Have your cholesterol checked regularly starting at age 89, sooner may be necessary if you have diabetes, high blood pressure, if a family member developed heart diseases at an early age or if you smoke.   Chlamydia, HIV, and other sexual transmitted disease- Get screened each year until the age of 71 then within three months of each new sexual partner.  Diabetes- Have your blood sugar checked  regularly if you have high blood pressure, high cholesterol, a family history of diabetes or if you are overweight.  Get these vaccines  Flu shot- Every fall.  Tetanus shot- Every 10 years.  Menactra- Single dose; prevents meningitis.  Take these steps  Don't smoke- If you do smoke, ask your healthcare provider about quitting. For tips on how to quit, go to www.smokefree.gov or call 1-800-QUIT-NOW.  Be physically active- Exercise 5 days a week for at least 30 minutes.  If you are not already physically active start slow and gradually work up to 30 minutes of moderate physical activity.  Examples of moderate activity include walking briskly, mowing the yard, dancing, swimming bicycling, etc.  Eat a healthy diet- Eat a variety of healthy foods such as fruits, vegetables, low fat milk, low fat cheese, yogurt, lean meats, poultry, fish, beans, tofu, etc.  For more information on healthy eating, go to www.thenutritionsource.org  Drink alcohol in moderation- Limit alcohol intake two drinks or less a day.  Never drink and drive.  Dentist- Brush and floss teeth twice daily; visit your dentis twice a year.  Depression-Your emotional health is as important as your physical health.  If you're feeling down, losing interest in things you normally enjoy please talk with your healthcare provider.  Gun Safety- If you keep a gun in your home, keep it unloaded and with the safety lock on.  Bullets should be stored separately.  Helmet use- Always wear a helmet when riding a  motorcycle, bicycle, rollerblading or skateboarding.  Safe sex- If you may be exposed to a sexually transmitted infection, use a condom  Seat belts- Seat bels can save your life; always wear one.  Smoke/Carbon Monoxide detectors- These detectors need to be installed on the appropriate level of your home.  Replace batteries at least once a year.  Skin Cancer- When out in the sun, cover up and use sunscreen SPF 15 or  higher.  Violence- If anyone is threatening or hurting you, please tell your healthcare provider.

## 2017-09-17 LAB — CMP14+EGFR
ALT: 99 IU/L — ABNORMAL HIGH (ref 0–44)
AST: 56 IU/L — AB (ref 0–40)
Albumin/Globulin Ratio: 2.2 (ref 1.2–2.2)
Albumin: 4.9 g/dL (ref 3.5–5.5)
Alkaline Phosphatase: 45 IU/L (ref 39–117)
BILIRUBIN TOTAL: 0.9 mg/dL (ref 0.0–1.2)
BUN/Creatinine Ratio: 10 (ref 9–20)
BUN: 11 mg/dL (ref 6–20)
CO2: 25 mmol/L (ref 20–29)
CREATININE: 1.07 mg/dL (ref 0.76–1.27)
Calcium: 9.7 mg/dL (ref 8.7–10.2)
Chloride: 96 mmol/L (ref 96–106)
GFR calc Af Amer: 108 mL/min/{1.73_m2} (ref >59)
GFR calc non Af Amer: 93 mL/min/{1.73_m2} (ref >59)
GLOBULIN, TOTAL: 2.2 g/dL (ref 1.5–4.5)
GLUCOSE: 217 mg/dL — AB (ref 65–99)
Potassium: 5.1 mmol/L (ref 3.5–5.2)
SODIUM: 136 mmol/L (ref 134–144)
Total Protein: 7.1 g/dL (ref 6.0–8.5)

## 2017-09-17 LAB — TSH: TSH: 3.2 u[IU]/mL (ref 0.450–4.500)

## 2017-09-17 LAB — CBC WITH DIFFERENTIAL/PLATELET
BASOS ABS: 0.1 10*3/uL (ref 0.0–0.2)
Basos: 1 %
EOS (ABSOLUTE): 0.2 10*3/uL (ref 0.0–0.4)
Eos: 2 %
Hematocrit: 46.4 % (ref 37.5–51.0)
Hemoglobin: 15.8 g/dL (ref 13.0–17.7)
Immature Grans (Abs): 0 10*3/uL (ref 0.0–0.1)
Immature Granulocytes: 0 %
LYMPHS ABS: 2.5 10*3/uL (ref 0.7–3.1)
Lymphs: 30 %
MCH: 29.6 pg (ref 26.6–33.0)
MCHC: 34.1 g/dL (ref 31.5–35.7)
MCV: 87 fL (ref 79–97)
MONOS ABS: 0.5 10*3/uL (ref 0.1–0.9)
Monocytes: 7 %
Neutrophils Absolute: 5 10*3/uL (ref 1.4–7.0)
Neutrophils: 60 %
Platelets: 280 10*3/uL (ref 150–450)
RBC: 5.33 x10E6/uL (ref 4.14–5.80)
RDW: 12.6 % (ref 12.3–15.4)
WBC: 8.4 10*3/uL (ref 3.4–10.8)

## 2017-09-17 LAB — LIPID PANEL
Chol/HDL Ratio: 4 ratio (ref 0.0–5.0)
Cholesterol, Total: 128 mg/dL (ref 100–199)
HDL: 32 mg/dL — AB (ref >39)
LDL Calculated: 75 mg/dL (ref 0–99)
Triglycerides: 103 mg/dL (ref 0–149)
VLDL Cholesterol Cal: 21 mg/dL (ref 5–40)

## 2017-09-17 LAB — HEMOGLOBIN A1C
ESTIMATED AVERAGE GLUCOSE: 189 mg/dL
HEMOGLOBIN A1C: 8.2 % — AB (ref 4.8–5.6)

## 2017-09-17 LAB — HIV ANTIBODY (ROUTINE TESTING W REFLEX): HIV SCREEN 4TH GENERATION: NONREACTIVE

## 2017-09-18 ENCOUNTER — Encounter: Payer: Self-pay | Admitting: Physician Assistant

## 2017-09-19 MED ORDER — EMPAGLIFLOZIN 10 MG PO TABS: 10.0000 mg | ORAL_TABLET | Freq: Every day | ORAL | 0 refills | Status: AC

## 2017-09-19 MED ORDER — ROSUVASTATIN CALCIUM 10 MG PO TABS: 10.0000 mg | ORAL_TABLET | Freq: Every day | ORAL | 3 refills | Status: AC

## 2017-09-19 NOTE — Addendum Note (Signed)
Addended by: Morrell Riddle on: 09/19/2017 05:55 PM   Modules accepted: Orders

## 2017-09-22 NOTE — Addendum Note (Signed)
Addended by: Baldwin Crown D on: 09/22/2017 08:34 AM   Modules accepted: Orders

## 2017-09-23 LAB — HEPATITIS B CORE ANTIBODY, TOTAL: Hep B Core Total Ab: NEGATIVE

## 2017-09-23 LAB — HCV INTERPRETATION

## 2017-09-23 LAB — HCV AB W REFLEX TO QUANT PCR: HCV Ab: <0.1 {s_co_ratio} (ref 0.0–0.9)

## 2017-09-23 LAB — HEPATITIS B SURFACE ANTIGEN: Hepatitis B Surface Ag: NEGATIVE

## 2017-09-29 ENCOUNTER — Encounter: Payer: Self-pay | Admitting: Physician Assistant

## 2017-11-12 ENCOUNTER — Telehealth: Payer: Self-pay

## 2017-11-12 ENCOUNTER — Institutional Professional Consult (permissible substitution): Payer: Self-pay | Admitting: Neurology

## 2017-11-12 NOTE — Telephone Encounter (Signed)
Pt did not show for their appt with Dr. Athar today.  

## 2017-11-13 ENCOUNTER — Encounter: Payer: Self-pay | Admitting: Neurology

## 2019-05-11 IMAGING — DX DG CERVICAL SPINE 2 OR 3 VIEWS
3 series · 3 of 3 positions shown · non-contrast
Comparison: None.

CLINICAL DATA: Neck pain for 3 weeks, no known injury, initial
encounter

EXAM:
CERVICAL SPINE - 2-3 VIEW

[c-spine lat]
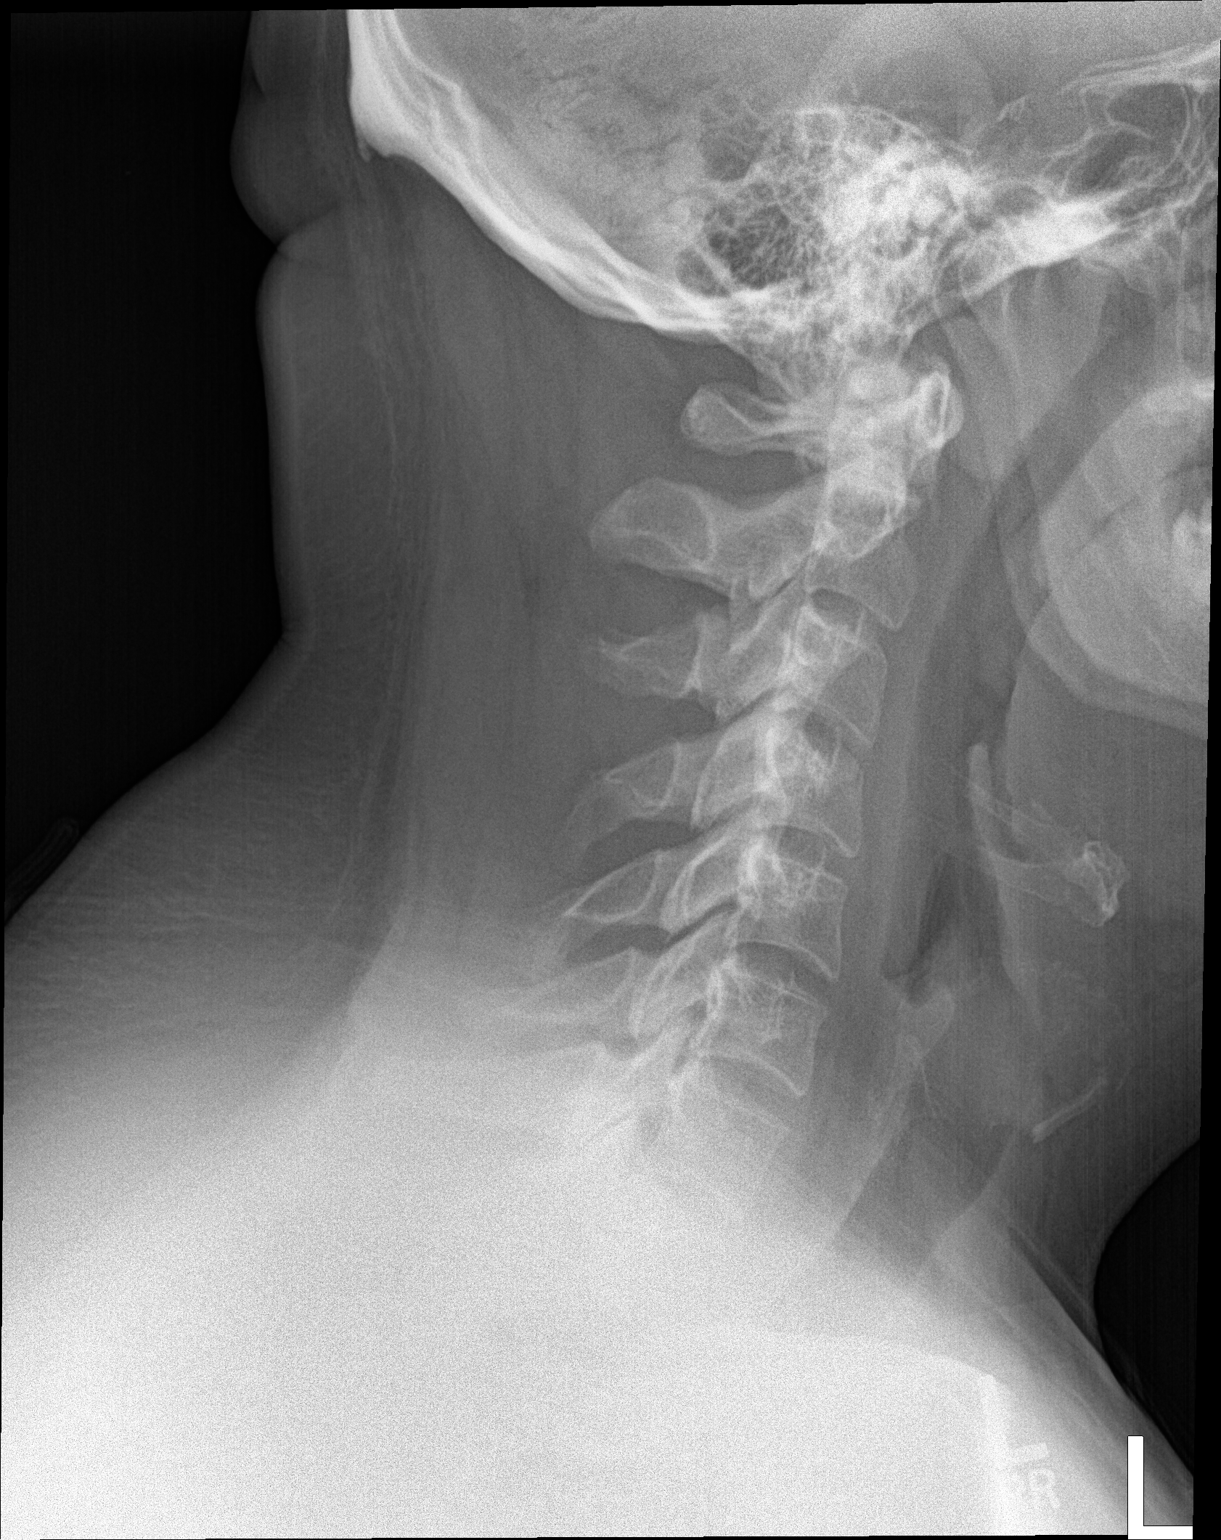

[c-spine ap]
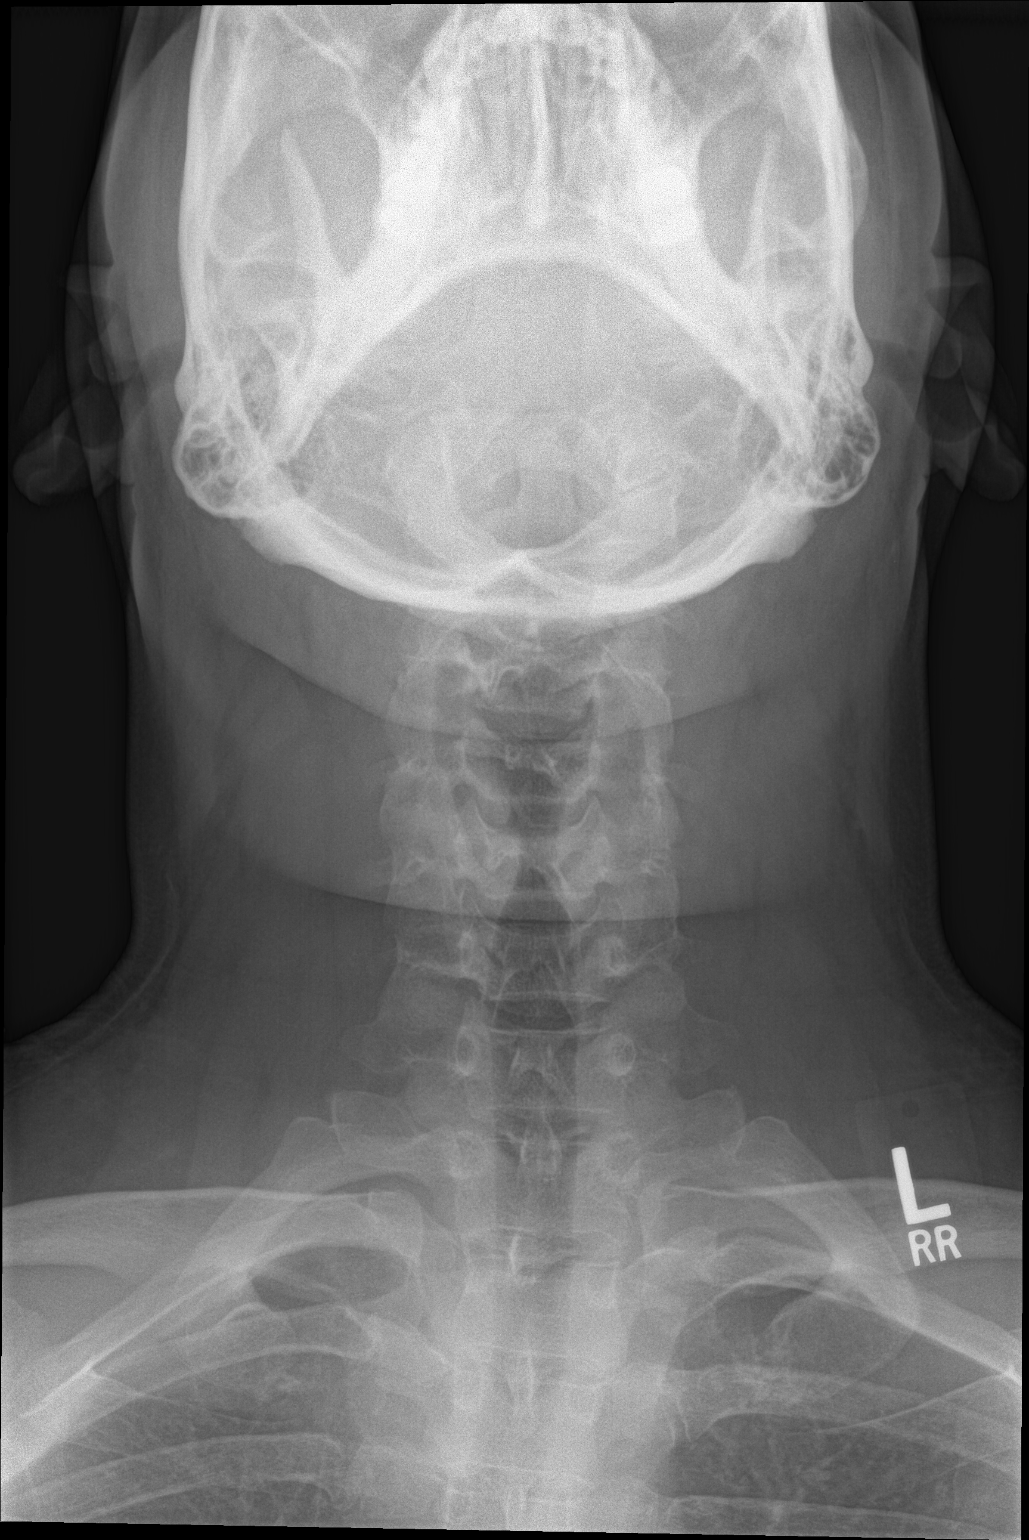

[c-spine open mouth]
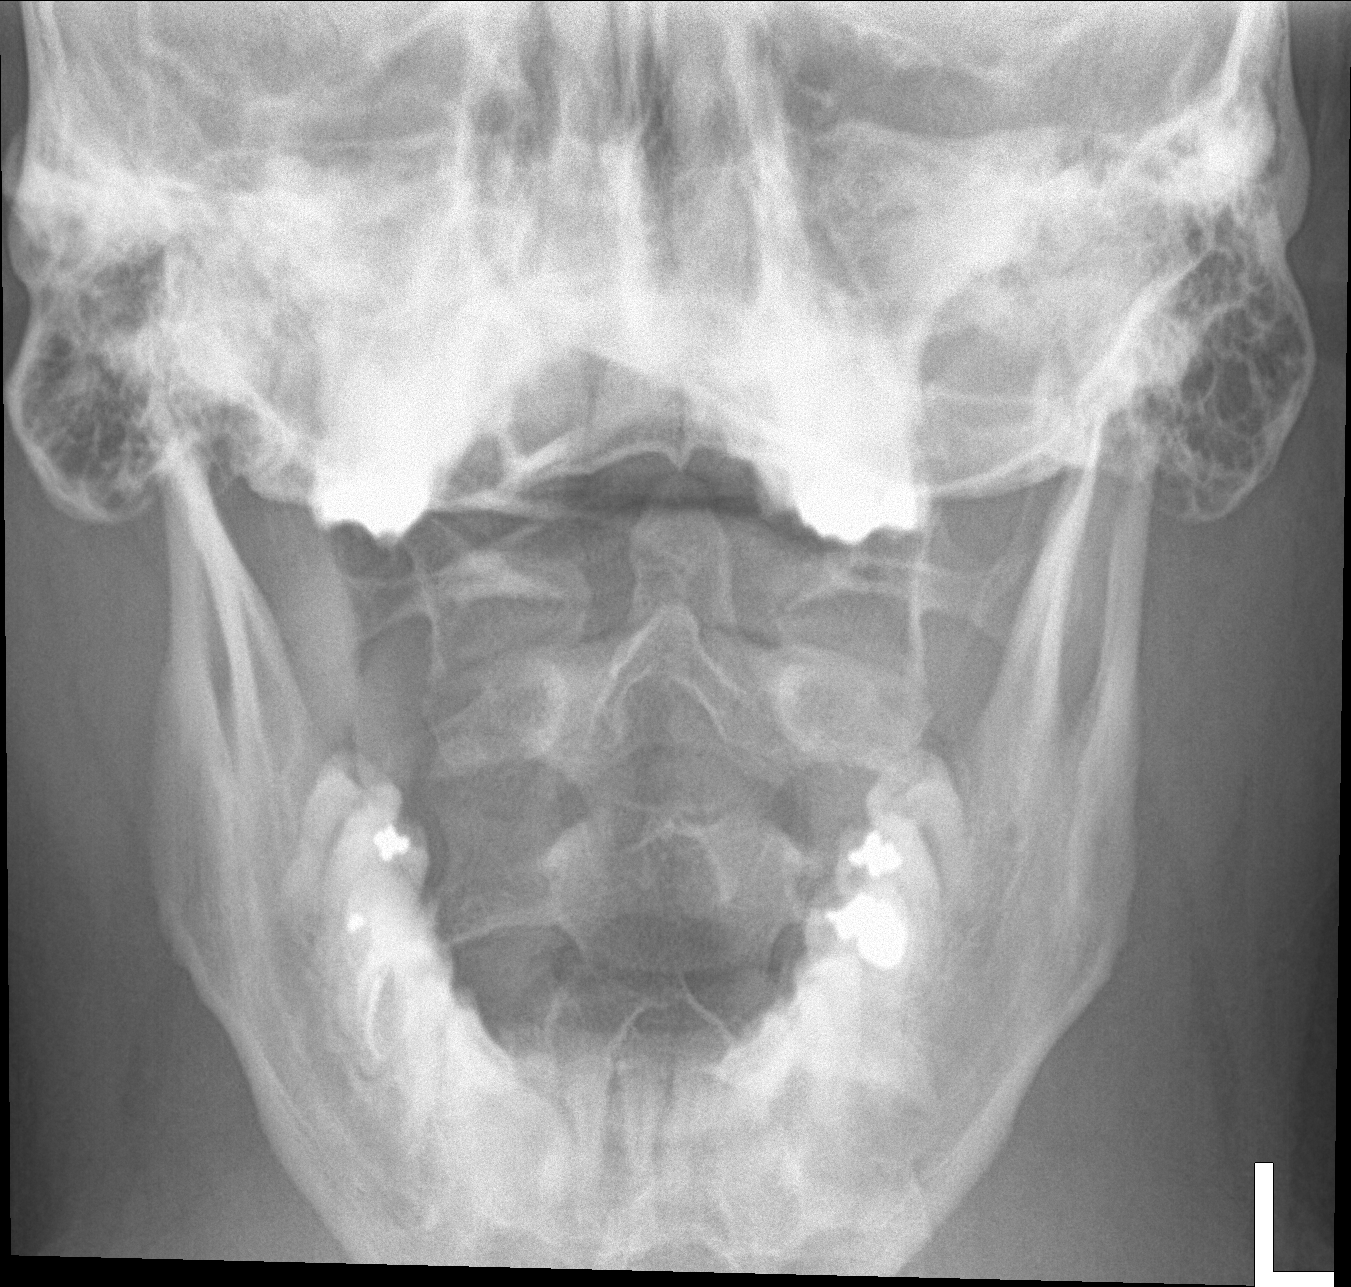

[3 of 3 positions shown; findings below may reference images not displayed]

FINDINGS: Seven cervical segments are well visualized. Vertebral body height
is well maintained. No prevertebral soft tissue changes are seen.
The odontoid is within normal limits.
IMPRESSION: No acute abnormality noted.

## 2019-09-06 ENCOUNTER — Ambulatory Visit
Admission: EM | Admit: 2019-09-06 | Discharge: 2019-09-06 | Disposition: A | Discharge status: Home / Self Care | Payer: BC Managed Care – PPO | Attending: Emergency Medicine | Admitting: Emergency Medicine

## 2019-09-06 ENCOUNTER — Other Ambulatory Visit: Payer: Self-pay

## 2019-09-06 DIAGNOSIS — Z1152 Encounter for screening for COVID-19: Secondary | ICD-10-CM

## 2019-09-06 DIAGNOSIS — J069 Acute upper respiratory infection, unspecified: Secondary | ICD-10-CM | POA: Diagnosis not present

## 2019-09-06 MED ORDER — FLUTICASONE PROPIONATE 50 MCG/ACT NA SUSP: 1.0000 | Freq: Every day | NASAL | 0 refills | Status: AC

## 2019-09-06 MED ORDER — ALBUTEROL SULFATE HFA 108 (90 BASE) MCG/ACT IN AERS: 1.0000 | INHALATION_SPRAY | Freq: Four times a day (QID) | RESPIRATORY_TRACT | 0 refills | Status: AC | PRN

## 2019-09-06 MED ORDER — CETIRIZINE HCL 10 MG PO TABS: 10.0000 mg | ORAL_TABLET | Freq: Every day | ORAL | 0 refills | Status: AC

## 2019-09-06 MED ORDER — BENZONATATE 100 MG PO CAPS: 100.0000 mg | ORAL_CAPSULE | Freq: Three times a day (TID) | ORAL | 0 refills | Status: AC

## 2019-09-06 NOTE — Discharge Instructions (Signed)
COVID testing ordered.  It will take between 2-7 days for test results.  Someone will contact you regarding abnormal results.    In the meantime: You should remain isolated in your home for 10 days from symptom onset AND greater than 24 hours after symptoms resolution (absence of fever without the use of fever-reducing medication and improvement in respiratory symptoms), whichever is longer Get plenty of rest and push fluids Tessalon Perles prescribed for cough ProAir was prescribed  Continue Mucinex Flonase for nasal congestion and runny nose Use medications daily for symptom relief Use OTC medications like ibuprofen or tylenol as needed fever or pain Call or go to the ED if you have any new or worsening symptoms such as fever, worsening cough, shortness of breath, chest tightness, chest pain, turning blue, changes in mental status, etc..Marland Kitchen

## 2019-09-06 NOTE — ED Triage Notes (Signed)
Pt presents with  Nasal congestion and sore throat that began 3 nights ago

## 2019-09-06 NOTE — ED Provider Notes (Signed)
[facility]   [id number] 09/06/19 Arrival Time: 1835   Chief Complaint  Patient presents with  . Nasal Congestion     SUBJECTIVE: History from: patient.  Jacob Odom is a 29 y.o. male who presented to the urgent care with a complaint of nasal congestion, cough and sore throat for the past 3 days.  Denies sick exposure to COVID, flu or strep.  Denies recent travel.  Has tried OTC medication without relief.  Denies alleviating or aggravating factors.  Denies previous symptoms in the past.   Denies fever, chills, fatigue, sinus pain, rhinorrhea, sore throat, SOB, wheezing, chest pain, nausea, changes in bowel or bladder habits.    ROS: As per HPI.  All for the past 3 days other pertinent ROS negative.      History reviewed. No pertinent past medical history. History reviewed. No pertinent surgical history. No Known Allergies No current facility-administered medications on file prior to encounter.   Current Outpatient Medications on File Prior to Encounter  Medication Sig Dispense Refill  . cyclobenzaprine (FLEXERIL) 5 MG tablet Take 1 tablet (5 mg total) by mouth 3 (three) times daily as needed for muscle spasms. 30 tablet 1  . empagliflozin (JARDIANCE) 10 MG TABS tablet Take 10 mg by mouth daily. 90 tablet 0  . metFORMIN (GLUCOPHAGE) 1000 MG tablet Take 1 tablet (1,000 mg total) by mouth 2 (two) times daily with a meal. 180 tablet 1  . rosuvastatin (CRESTOR) 10 MG tablet Take 1 tablet (10 mg total) by mouth daily. 90 tablet 3   Social History   Socioeconomic History  . Marital status: Single    Spouse name: Not on file  . Number of children: 1  . Years of education: Not on file  . Highest education level: Not on file  Occupational History  . Not on file  Tobacco Use  . Smoking status: Former Smoker    Packs/day: 1.00    Years: 9.00    Pack years: 9.00    Types: Cigarettes, E-cigarettes    Quit date: 07/16/2017    Years since quitting: 2.1  . Smokeless  tobacco: Never Used  Vaping Use  . Vaping Use: Every day  Substance and Sexual Activity  . Alcohol use: Yes    Alcohol/week: 0.0 standard drinks    Comment: occ  . Drug use: No  . Sexual activity: Yes    Partners: Female  Other Topics Concern  . Not on file  Social History Narrative   Lives with finance and son      Seatbelt - no   Gun in home- secured   Social Determinants of Health   Financial Resource Strain:   . Difficulty of Paying Living Expenses: Not on file  Food Insecurity:   . Worried About Programme researcher, broadcasting/film/video in the Last Year: Not on file  . Ran Out of Food in the Last Year: Not on file  Transportation Needs:   . Lack of Transportation (Medical): Not on file  . Lack of Transportation (Non-Medical): Not on file  Physical Activity:   . Days of Exercise per Week: Not on file  . Minutes of Exercise per Session: Not on file  Stress:   . Feeling of Stress : Not on file  Social Connections:   . Frequency of Communication with Friends and Family: Not on file  . Frequency of Social Gatherings with Friends and Family: Not on file  . Attends Religious Services: Not on file  . Active  Member of Clubs or Organizations: Not on file  . Attends Banker Meetings: Not on file  . Marital Status: Not on file  Intimate Partner Violence:   . Fear of Current or Ex-Partner: Not on file  . Emotionally Abused: Not on file  . Physically Abused: Not on file  . Sexually Abused: Not on file   Family History  Problem Relation Age of Onset  . Suicidality Father   . High Cholesterol Sister   . Diabetes Sister     OBJECTIVE:  Vitals:   09/06/19 1847  BP: (!) 149/97  Pulse: 90  Resp: 20  Temp: 98.3 F (36.8 C)  SpO2: 96%     General appearance: alert; appears fatigued, but nontoxic; speaking in full sentences and tolerating own secretions HEENT: NCAT; Ears: EACs clear, TMs pearly gray; Eyes: PERRL.  EOM grossly intact. Sinuses: nontender; Nose: nares patent  without rhinorrhea, Throat: oropharynx clear, tonsils non erythematous or enlarged, uvula midline  Neck: supple without LAD Lungs: unlabored respirations, symmetrical air entry; cough: mild; no respiratory distress; CTAB Heart: regular rate and rhythm.  Radial pulses 2+ symmetrical bilaterally Skin: warm and dry Psychological: alert and cooperative; normal mood and affect  LABS:  No results found for this or any previous visit (from the past 24 hour(s)).   ASSESSMENT & PLAN:  1. Encounter for screening for COVID-19   2. Viral URI with cough     Meds ordered this encounter  Medications  . benzonatate (TESSALON) 100 MG capsule    Sig: Take 1 capsule (100 mg total) by mouth every 8 (eight) hours.    Dispense:  30 capsule    Refill:  0  . albuterol (VENTOLIN HFA) 108 (90 Base) MCG/ACT inhaler    Sig: Inhale 1-2 puffs into the lungs every 6 (six) hours as needed for wheezing or shortness of breath.    Dispense:  18 g    Refill:  0  . fluticasone (FLONASE) 50 MCG/ACT nasal spray    Sig: Place 1 spray into both nostrils daily for 14 days.    Dispense:  16 g    Refill:  0  . cetirizine (ZYRTEC ALLERGY) 10 MG tablet    Sig: Take 1 tablet (10 mg total) by mouth daily.    Dispense:  30 tablet    Refill:  0    Discharge Instructions  COVID testing ordered.  It will take between 2-7 days for test results.  Someone will contact you regarding abnormal results.    In the meantime: You should remain isolated in your home for 10 days from symptom onset AND greater than 24 hours after symptoms resolution (absence of fever without the use of fever-reducing medication and improvement in respiratory symptoms), whichever is longer Get plenty of rest and push fluids Tessalon Perles prescribed for cough ProAir was prescribed  Continue Mucinex Flonase for nasal congestion and runny nose Use medications daily for symptom relief Use OTC medications like ibuprofen or tylenol as needed fever or  pain Call or go to the ED if you have any new or worsening symptoms such as fever, worsening cough, shortness of breath, chest tightness, chest pain, turning blue, changes in mental status, etc...   Reviewed expectations re: course of current medical issues. Questions answered. Outlined signs and symptoms indicating need for more acute intervention. Patient verbalized understanding. After Visit Summary given.      Note: This document was prepared using Dragon voice recognition software and may include unintentional dictation  errors.    Durward Parcel, FNP 09/06/19 1913

## 2019-09-08 LAB — SARS-COV-2, NAA 2 DAY TAT

## 2019-09-08 LAB — NOVEL CORONAVIRUS, NAA: SARS-CoV-2, NAA: NOT DETECTED

## 2019-10-27 ENCOUNTER — Telehealth (HOSPITAL_COMMUNITY): Payer: Self-pay | Admitting: Oncology

## 2019-10-27 ENCOUNTER — Encounter: Payer: Self-pay | Admitting: Oncology

## 2019-10-27 NOTE — Telephone Encounter (Signed)
Re: [facility]  Called to Discuss with patient about Covid symptoms and the use of regeneron, a monoclonal antibody infusion for those with mild to moderate Covid symptoms and at a high risk of hospitalization.     Pt is qualified for this infusion at the [facility] infusion center due to co-morbid conditions and/or a member of an at-risk group.    No past medical history on file.  Specific risk condition-Obesity    Unable to reach pt. Left VM and MCM.  Mignon Pine, AGNP-C [phone] (Infusion Center Hotline)

## 2020-02-17 NOTE — Telephone Encounter (Signed)
Error
